# Patient Record
Sex: Male | Born: 1935 | Race: White | Hispanic: No | Marital: Married | Smoking: Current every day smoker
Health system: Southern US, Community
[De-identification: ages and names within clinical notes are randomized; demographics above are authoritative.]

## PROBLEM LIST (undated history)

## (undated) DIAGNOSIS — I639 Cerebral infarction, unspecified: Secondary | ICD-10-CM

## (undated) DIAGNOSIS — R0602 Shortness of breath: Secondary | ICD-10-CM

## (undated) DIAGNOSIS — J189 Pneumonia, unspecified organism: Secondary | ICD-10-CM

## (undated) DIAGNOSIS — R51 Headache: Secondary | ICD-10-CM

## (undated) DIAGNOSIS — I10 Essential (primary) hypertension: Secondary | ICD-10-CM

## (undated) DIAGNOSIS — I219 Acute myocardial infarction, unspecified: Secondary | ICD-10-CM

## (undated) DIAGNOSIS — G709 Myoneural disorder, unspecified: Secondary | ICD-10-CM

## (undated) DIAGNOSIS — S32000A Wedge compression fracture of unspecified lumbar vertebra, initial encounter for closed fracture: Secondary | ICD-10-CM

## (undated) DIAGNOSIS — I2510 Atherosclerotic heart disease of native coronary artery without angina pectoris: Secondary | ICD-10-CM

## (undated) DIAGNOSIS — F32A Depression, unspecified: Secondary | ICD-10-CM

## (undated) DIAGNOSIS — I739 Peripheral vascular disease, unspecified: Secondary | ICD-10-CM

## (undated) DIAGNOSIS — K219 Gastro-esophageal reflux disease without esophagitis: Secondary | ICD-10-CM

## (undated) DIAGNOSIS — G20A1 Parkinson's disease without dyskinesia, without mention of fluctuations: Secondary | ICD-10-CM

## (undated) DIAGNOSIS — F329 Major depressive disorder, single episode, unspecified: Secondary | ICD-10-CM

## (undated) DIAGNOSIS — G20 Parkinson's disease: Secondary | ICD-10-CM

## (undated) HISTORY — PX: BYPASS GRAFT: SHX909

## (undated) HISTORY — DX: Parkinson's disease without dyskinesia, without mention of fluctuations: G20.A1

## (undated) HISTORY — DX: Atherosclerotic heart disease of native coronary artery without angina pectoris: I25.10

## (undated) HISTORY — PX: CAROTID ENDARTERECTOMY: SUR193

## (undated) HISTORY — DX: Peripheral vascular disease, unspecified: I73.9

## (undated) HISTORY — DX: Parkinson's disease: G20

## (undated) HISTORY — PX: CARPAL TUNNEL RELEASE: SHX101

## (undated) HISTORY — PX: EYE SURGERY: SHX253

## (undated) HISTORY — PX: CARDIAC CATHETERIZATION: SHX172

---

## 2004-03-03 ENCOUNTER — Other Ambulatory Visit: Payer: Self-pay

## 2005-07-24 ENCOUNTER — Ambulatory Visit: Payer: Self-pay | Admitting: Gastroenterology

## 2005-12-05 ENCOUNTER — Ambulatory Visit: Payer: Self-pay | Admitting: Neurology

## 2005-12-12 ENCOUNTER — Encounter: Payer: Self-pay | Admitting: Neurology

## 2005-12-20 ENCOUNTER — Encounter: Payer: Self-pay | Admitting: Neurology

## 2006-01-17 ENCOUNTER — Encounter: Payer: Self-pay | Admitting: Neurology

## 2007-05-22 ENCOUNTER — Inpatient Hospital Stay: Payer: Self-pay | Admitting: Internal Medicine

## 2007-05-22 ENCOUNTER — Other Ambulatory Visit: Payer: Self-pay

## 2007-06-04 ENCOUNTER — Ambulatory Visit: Payer: Self-pay | Admitting: Vascular Surgery

## 2007-06-17 ENCOUNTER — Inpatient Hospital Stay: Payer: Self-pay | Admitting: Vascular Surgery

## 2007-06-20 ENCOUNTER — Other Ambulatory Visit: Payer: Self-pay

## 2007-06-21 ENCOUNTER — Inpatient Hospital Stay: Payer: Self-pay | Admitting: *Deleted

## 2009-02-22 ENCOUNTER — Ambulatory Visit: Payer: Self-pay | Admitting: Unknown Physician Specialty

## 2009-05-03 ENCOUNTER — Ambulatory Visit: Payer: Self-pay | Admitting: Family Medicine

## 2010-02-15 ENCOUNTER — Ambulatory Visit: Payer: Self-pay | Admitting: Ophthalmology

## 2010-02-28 ENCOUNTER — Ambulatory Visit: Payer: Self-pay | Admitting: Ophthalmology

## 2010-10-02 ENCOUNTER — Emergency Department: Payer: Self-pay | Admitting: Emergency Medicine

## 2010-10-10 ENCOUNTER — Ambulatory Visit: Payer: Self-pay | Admitting: Ophthalmology

## 2010-10-17 ENCOUNTER — Ambulatory Visit: Payer: Self-pay | Admitting: Ophthalmology

## 2010-11-16 ENCOUNTER — Ambulatory Visit: Payer: Self-pay | Admitting: Family Medicine

## 2011-05-22 ENCOUNTER — Emergency Department: Payer: Self-pay | Admitting: Emergency Medicine

## 2011-06-06 ENCOUNTER — Ambulatory Visit: Payer: Self-pay | Admitting: Family Medicine

## 2011-06-11 ENCOUNTER — Ambulatory Visit: Payer: Self-pay | Admitting: Family Medicine

## 2011-09-08 ENCOUNTER — Inpatient Hospital Stay: Payer: Self-pay | Admitting: Internal Medicine

## 2011-10-22 ENCOUNTER — Other Ambulatory Visit: Payer: Self-pay | Admitting: Neurology

## 2011-10-22 DIAGNOSIS — W19XXXA Unspecified fall, initial encounter: Secondary | ICD-10-CM

## 2011-10-22 DIAGNOSIS — G20 Parkinson's disease: Secondary | ICD-10-CM

## 2011-10-22 DIAGNOSIS — R269 Unspecified abnormalities of gait and mobility: Secondary | ICD-10-CM

## 2011-10-29 ENCOUNTER — Ambulatory Visit
Admission: RE | Admit: 2011-10-29 | Discharge: 2011-10-29 | Disposition: A | Discharge status: Home / Self Care | Payer: PRIVATE HEALTH INSURANCE | Source: Ambulatory Visit | Attending: Neurology | Admitting: Neurology

## 2011-10-29 DIAGNOSIS — R269 Unspecified abnormalities of gait and mobility: Secondary | ICD-10-CM

## 2011-10-29 DIAGNOSIS — W19XXXA Unspecified fall, initial encounter: Secondary | ICD-10-CM

## 2011-10-29 DIAGNOSIS — G20 Parkinson's disease: Secondary | ICD-10-CM

## 2012-02-06 ENCOUNTER — Encounter (HOSPITAL_COMMUNITY): Payer: Self-pay | Admitting: Pharmacy Technician

## 2012-02-08 ENCOUNTER — Other Ambulatory Visit (HOSPITAL_COMMUNITY): Payer: Self-pay | Admitting: *Deleted

## 2012-02-11 ENCOUNTER — Ambulatory Visit (HOSPITAL_COMMUNITY)
Admission: RE | Admit: 2012-02-11 | Discharge: 2012-02-11 | Disposition: A | Discharge status: Home / Self Care | Payer: Medicare Other | Source: Ambulatory Visit | Attending: Anesthesiology | Admitting: Anesthesiology

## 2012-02-11 ENCOUNTER — Encounter (HOSPITAL_COMMUNITY): Payer: Self-pay

## 2012-02-11 ENCOUNTER — Other Ambulatory Visit: Payer: Self-pay

## 2012-02-11 ENCOUNTER — Encounter (HOSPITAL_COMMUNITY)
Admission: RE | Admit: 2012-02-11 | Discharge: 2012-02-11 | Disposition: A | Discharge status: Home / Self Care | Payer: Medicare Other | Source: Ambulatory Visit | Attending: Neurosurgery | Admitting: Neurosurgery

## 2012-02-11 DIAGNOSIS — Z01818 Encounter for other preprocedural examination: Secondary | ICD-10-CM | POA: Insufficient documentation

## 2012-02-11 DIAGNOSIS — I10 Essential (primary) hypertension: Secondary | ICD-10-CM | POA: Insufficient documentation

## 2012-02-11 DIAGNOSIS — J90 Pleural effusion, not elsewhere classified: Secondary | ICD-10-CM | POA: Insufficient documentation

## 2012-02-11 DIAGNOSIS — R911 Solitary pulmonary nodule: Secondary | ICD-10-CM | POA: Insufficient documentation

## 2012-02-11 HISTORY — DX: Wedge compression fracture of unspecified lumbar vertebra, initial encounter for closed fracture: S32.000A

## 2012-02-11 HISTORY — DX: Gastro-esophageal reflux disease without esophagitis: K21.9

## 2012-02-11 HISTORY — DX: Pneumonia, unspecified organism: J18.9

## 2012-02-11 HISTORY — DX: Essential (primary) hypertension: I10

## 2012-02-11 HISTORY — DX: Shortness of breath: R06.02

## 2012-02-11 HISTORY — DX: Headache: R51

## 2012-02-11 HISTORY — DX: Myoneural disorder, unspecified: G70.9

## 2012-02-11 HISTORY — DX: Acute myocardial infarction, unspecified: I21.9

## 2012-02-11 HISTORY — DX: Depression, unspecified: F32.A

## 2012-02-11 HISTORY — DX: Cerebral infarction, unspecified: I63.9

## 2012-02-11 HISTORY — DX: Major depressive disorder, single episode, unspecified: F32.9

## 2012-02-11 LAB — BASIC METABOLIC PANEL
CO2: 27 mEq/L (ref 19–32)
Calcium: 10.5 mg/dL (ref 8.4–10.5)
GFR calc Af Amer: 74 mL/min — ABNORMAL LOW (ref >90)
Sodium: 141 mEq/L (ref 135–145)

## 2012-02-11 LAB — SURGICAL PCR SCREEN
MRSA, PCR: NEGATIVE
Staphylococcus aureus: NEGATIVE

## 2012-02-11 LAB — CBC
MCH: 34.5 pg — ABNORMAL HIGH (ref 26.0–34.0)
Platelets: 185 10*3/uL (ref 150–400)
RBC: 4.9 MIL/uL (ref 4.22–5.81)
RDW: 13.7 % (ref 11.5–15.5)
WBC: 7.8 10*3/uL (ref 4.0–10.5)

## 2012-02-11 NOTE — Progress Notes (Signed)
Requested records fr. Gavin Potters, re: cardiac history

## 2012-02-11 NOTE — Pre-Procedure Instructions (Signed)
20 Stephen Rollins  02/11/2012   Your procedure is scheduled on:  02/14/2012  Report to [name] at 5:30 AM.  Call this number if you have problems the morning of surgery: [phone]   Remember:   Do not eat food:After Midnight.  May have clear liquids: up to 4 Hours before arrival.  Clear liquids include soda, tea, black coffee, apple or grape juice, broth.  Take these medicines the morning of surgery with A SIP OF WATER:  SINEMET   Do not wear jewelry, make-up or nail polish.  Do not wear lotions, powders, or perfumes. You may wear deodorant.  Do not shave 48 hours prior to surgery.  Do not bring valuables to the hospital.  Contacts, dentures or bridgework may not be worn into surgery.  Leave suitcase in the car. After surgery it may be brought to your room.  For patients admitted to the hospital, checkout time is 11:00 AM the day of discharge.   Patients discharged the day of surgery will not be allowed to drive home.  Name and phone number of your driver:   With WIFE  Special Instructions: CHG Shower Use Special Wash: 1/2 bottle night before surgery and 1/2 bottle morning of surgery.   Please read over the following fact sheets that you were given: Pain Booklet, Coughing and Deep Breathing, MRSA Information and Surgical Site Infection Prevention

## 2012-02-11 NOTE — Progress Notes (Signed)
Will leave chart for review /w A. Zelenak, PAC, due to CXR report

## 2012-02-12 ENCOUNTER — Encounter (HOSPITAL_COMMUNITY): Payer: Self-pay | Admitting: Vascular Surgery

## 2012-02-12 NOTE — Consult Note (Addendum)
Anesthesia:  Patient is a 77 year old male scheduled for a L2 vertebroplasty on 02/14/12.  History includes smoking, HTN, CVA, CAD/ inferior MI s/p PCI 06/2001, GERD, depression, PNA, headaches, PVD s/p AFBG, Parkinson's disease, dementia.  By notes, he has known carotid disease that is followed by Dr. Earnestine Rollins.  His Cardiologist is Dr. Gwen Rollins.  He was last seen by his NP Stephen Rollins on 11/21/10 for dizziness and "presyncope".  A Holter test was ordered and consideration of a stress test if he was still "having spells that cannot be explained."  I called and spoke with Stephen Rollins.  He denies ever feeling like he was going to pass out, but continues to feel "wobbly" particularly when getting up from bed or his recliner in the morning and evening.  He feels these symptoms have "leveled off" and admits to feeling generally well.  He denies CP, SOB, LE edema.  He is not particularly active, but can do his day to day activities, shopping, etc., as long as he takes his time.  He reports that the Holter results were normal, but actual records are still pending.  His last stress test was in 2005.    His last echo on 11/25/06 showed normal LV systolic fucntion with EF 50%, trivial to mild MR/TR.  His EKG from 02/11/12 shows NSR, non-specific (flat) T wave abnormality in anterolateral leads.    CXR report from 02/11/12 showed: "No pulmonary edema, pneumonia, pleural effusion is evident. A small nodular density projects over the inferior aspect of the cardiac silhouette on lateral image. I cannot definitely identify it on the PA image. It measures 9 x 5 mm in diameter. It is not definitely calcified. Does the patient have any prior radiographic examinations elsewhere which could be obtained for comparison? If no prior study is available for comparison CT of the chest would be suggested to determine whether pulmonary nodule is present and to  further characterize it." I've left a message and with Stephen Rollins at [facility]  re: CXR results and have asked her to confirm with me once she receives the message.  Labs acceptable.  I reviewed his Cardiac history and symptomology with Anesthesiologist Dr. Katrinka Blazing.  If his Holter results did not reveal any significant/sustained abnormality and if he does not exhibit any new or progressive symptoms, then anticipate he can proceed.  I will follow-up with Stephen Rollins once I review his remaining Cardiology records.  Addendum: 02/13/12 1715  Earlier today I reviewed his 48 Holter monitor results from 11/28/10.  It showed baseline NSR with max rate of 89 and minimum of 53 bpm.  There were occasional PACs and PVCs, but no evidence of significant dysrhythmia or heart block.  I also called Stephen Rollins office multiple times today to confirm that he was aware of patient's CXR results.  I was continually placed into voicemail, so I again left a message for Stephen Rollins and also one with Stephen Rollins.  A copy of the report was also faxed to his office with confirmation.  Stephen Rollins's surgery is scheduled for 0730 tomorrow, so his assigned Anesthesiologist will have to confirm Stephen Rollins knowledge of his preoperative CXR results at that time.

## 2012-02-13 MED ORDER — CEFAZOLIN SODIUM 1-5 GM-% IV SOLN
1.0000 g | Freq: Once | INTRAVENOUS | Status: AC
  Administered 2012-02-14: 1 g via INTRAVENOUS
  Filled 2012-02-13: qty 50

## 2012-02-14 ENCOUNTER — Encounter (HOSPITAL_COMMUNITY): Payer: Self-pay | Admitting: Vascular Surgery

## 2012-02-14 ENCOUNTER — Encounter (HOSPITAL_COMMUNITY)
Admission: RE | Disposition: A | Discharge status: Home / Self Care | Payer: Self-pay | Source: Ambulatory Visit | Attending: Neurosurgery

## 2012-02-14 ENCOUNTER — Ambulatory Visit (HOSPITAL_COMMUNITY)
Admission: RE | Admit: 2012-02-14 | Discharge: 2012-02-14 | Disposition: A | Discharge status: Home / Self Care | Payer: PRIVATE HEALTH INSURANCE | Source: Ambulatory Visit | Attending: Neurosurgery | Admitting: Neurosurgery

## 2012-02-14 ENCOUNTER — Ambulatory Visit (HOSPITAL_COMMUNITY): Payer: PRIVATE HEALTH INSURANCE

## 2012-02-14 ENCOUNTER — Ambulatory Visit (HOSPITAL_COMMUNITY): Payer: PRIVATE HEALTH INSURANCE | Admitting: Vascular Surgery

## 2012-02-14 DIAGNOSIS — I10 Essential (primary) hypertension: Secondary | ICD-10-CM | POA: Insufficient documentation

## 2012-02-14 DIAGNOSIS — F329 Major depressive disorder, single episode, unspecified: Secondary | ICD-10-CM | POA: Insufficient documentation

## 2012-02-14 DIAGNOSIS — S32020A Wedge compression fracture of second lumbar vertebra, initial encounter for closed fracture: Secondary | ICD-10-CM

## 2012-02-14 DIAGNOSIS — G20A1 Parkinson's disease without dyskinesia, without mention of fluctuations: Secondary | ICD-10-CM | POA: Insufficient documentation

## 2012-02-14 DIAGNOSIS — F0390 Unspecified dementia without behavioral disturbance: Secondary | ICD-10-CM | POA: Insufficient documentation

## 2012-02-14 DIAGNOSIS — F3289 Other specified depressive episodes: Secondary | ICD-10-CM | POA: Insufficient documentation

## 2012-02-14 DIAGNOSIS — I252 Old myocardial infarction: Secondary | ICD-10-CM | POA: Insufficient documentation

## 2012-02-14 DIAGNOSIS — K219 Gastro-esophageal reflux disease without esophagitis: Secondary | ICD-10-CM | POA: Insufficient documentation

## 2012-02-14 DIAGNOSIS — M8448XA Pathological fracture, other site, initial encounter for fracture: Secondary | ICD-10-CM | POA: Insufficient documentation

## 2012-02-14 DIAGNOSIS — G20 Parkinson's disease: Secondary | ICD-10-CM | POA: Insufficient documentation

## 2012-02-14 DIAGNOSIS — I2510 Atherosclerotic heart disease of native coronary artery without angina pectoris: Secondary | ICD-10-CM | POA: Insufficient documentation

## 2012-02-14 DIAGNOSIS — Z8673 Personal history of transient ischemic attack (TIA), and cerebral infarction without residual deficits: Secondary | ICD-10-CM | POA: Insufficient documentation

## 2012-02-14 HISTORY — PX: VERTEBROPLASTY: SHX113

## 2012-02-14 SURGERY — VERTEBROPLASTY
Anesthesia: General | Site: Back | Wound class: Clean

## 2012-02-14 MED ORDER — IOHEXOL 300 MG/ML  SOLN
INTRAMUSCULAR | Status: DC | PRN
  Administered 2012-02-14: 50 mL via INTRAVENOUS

## 2012-02-14 MED ORDER — BUPIVACAINE HCL (PF) 0.25 % IJ SOLN
INTRAMUSCULAR | Status: DC | PRN
  Administered 2012-02-14: 10 mL

## 2012-02-14 MED ORDER — LACTATED RINGERS IV SOLN
INTRAVENOUS | Status: DC | PRN
  Administered 2012-02-14: 07:00:00 via INTRAVENOUS

## 2012-02-14 MED ORDER — ONDANSETRON HCL 4 MG/2ML IJ SOLN: 4.0000 mg | Freq: Once | INTRAMUSCULAR | Status: DC | PRN

## 2012-02-14 MED ORDER — PROPOFOL 10 MG/ML IV BOLUS
INTRAVENOUS | Status: DC | PRN
  Administered 2012-02-14: 150 mg via INTRAVENOUS

## 2012-02-14 MED ORDER — EPHEDRINE SULFATE 50 MG/ML IJ SOLN
INTRAMUSCULAR | Status: DC | PRN
  Administered 2012-02-14: 15 mg via INTRAVENOUS
  Administered 2012-02-14: 5 mg via INTRAVENOUS

## 2012-02-14 MED ORDER — LIDOCAINE HCL (CARDIAC) 20 MG/ML IV SOLN
INTRAVENOUS | Status: DC | PRN
  Administered 2012-02-14: 50 mg via INTRAVENOUS

## 2012-02-14 MED ORDER — HYDROMORPHONE HCL PF 1 MG/ML IJ SOLN: 0.2500 mg | INTRAMUSCULAR | Status: DC | PRN

## 2012-02-14 MED ORDER — HYDROCODONE-ACETAMINOPHEN 5-500 MG PO TABS: 1.0000 | ORAL_TABLET | Freq: Four times a day (QID) | ORAL | Status: AC | PRN

## 2012-02-14 MED ORDER — NEOSTIGMINE METHYLSULFATE 1 MG/ML IJ SOLN
INTRAMUSCULAR | Status: DC | PRN
  Administered 2012-02-14: 4 mg via INTRAVENOUS

## 2012-02-14 MED ORDER — GLYCOPYRROLATE 0.2 MG/ML IJ SOLN
INTRAMUSCULAR | Status: DC | PRN
  Administered 2012-02-14: 0.6 mg via INTRAVENOUS

## 2012-02-14 MED ORDER — FENTANYL CITRATE 0.05 MG/ML IJ SOLN
INTRAMUSCULAR | Status: DC | PRN
  Administered 2012-02-14: 100 ug via INTRAVENOUS

## 2012-02-14 MED ORDER — VECURONIUM BROMIDE 10 MG IV SOLR
INTRAVENOUS | Status: DC | PRN
  Administered 2012-02-14: 8 mg via INTRAVENOUS

## 2012-02-14 SURGICAL SUPPLY — 37 items
BANDAGE ADHESIVE 1X3 (GAUZE/BANDAGES/DRESSINGS) ×2 IMPLANT
BLADE SURG 15 STRL LF DISP TIS (BLADE) ×1 IMPLANT
BLADE SURG 15 STRL SS (BLADE) ×1
BLADE SURG ROTATE 9660 (MISCELLANEOUS) IMPLANT
CEMENT BONE KYPHX HV R (Orthopedic Implant) ×2 IMPLANT
CEMENT KYPHON C01A KIT/MIXER (Cement) ×2 IMPLANT
CLOTH BEACON ORANGE TIMEOUT ST (SAFETY) ×2 IMPLANT
CONT SPEC 4OZ CLIKSEAL STRL BL (MISCELLANEOUS) ×2 IMPLANT
DRAPE C-ARM 42X72 X-RAY (DRAPES) ×2 IMPLANT
DRAPE INCISE IOBAN 66X45 STRL (DRAPES) ×2 IMPLANT
DRAPE LAPAROTOMY 100X72X124 (DRAPES) ×2 IMPLANT
DURAPREP 26ML APPLICATOR (WOUND CARE) ×2 IMPLANT
GAUZE SPONGE 4X4 16PLY XRAY LF (GAUZE/BANDAGES/DRESSINGS) ×2 IMPLANT
GLOVE BIOGEL PI IND STRL 7.0 (GLOVE) ×2 IMPLANT
GLOVE BIOGEL PI INDICATOR 7.0 (GLOVE) ×2
GLOVE ECLIPSE 7.5 STRL STRAW (GLOVE) ×2 IMPLANT
GLOVE EXAM NITRILE LRG STRL (GLOVE) IMPLANT
GLOVE EXAM NITRILE XL STR (GLOVE) IMPLANT
GLOVE EXAM NITRILE XS STR PU (GLOVE) IMPLANT
GLOVE SS BIOGEL STRL SZ 6.5 (GLOVE) ×1 IMPLANT
GLOVE SUPERSENSE BIOGEL SZ 6.5 (GLOVE) ×1
GLOVE SURG SS PI 6.5 STRL IVOR (GLOVE) ×2 IMPLANT
GLOVE SURG SS PI 7.0 STRL IVOR (GLOVE) ×2 IMPLANT
GOWN BRE IMP SLV AUR LG STRL (GOWN DISPOSABLE) ×6 IMPLANT
GOWN BRE IMP SLV AUR XL STRL (GOWN DISPOSABLE) IMPLANT
GOWN STRL REIN 2XL LVL4 (GOWN DISPOSABLE) IMPLANT
KIT BASIN OR (CUSTOM PROCEDURE TRAY) ×2 IMPLANT
KIT ROOM TURNOVER OR (KITS) ×2 IMPLANT
KYPHON MIXER PACK ×2 IMPLANT
MARKER SKIN DUAL TIP RULER LAB (MISCELLANEOUS) ×2 IMPLANT
NEEDLE HYPO 22GX1.5 SAFETY (NEEDLE) ×2 IMPLANT
PACK EENT II TURBAN DRAPE (CUSTOM PROCEDURE TRAY) ×2 IMPLANT
PAD ARMBOARD 7.5X6 YLW CONV (MISCELLANEOUS) ×4 IMPLANT
STAPLER SKIN PROX WIDE 3.9 (STAPLE) ×2 IMPLANT
TOWEL OR 17X24 6PK STRL BLUE (TOWEL DISPOSABLE) ×2 IMPLANT
TOWEL OR 17X26 10 PK STRL BLUE (TOWEL DISPOSABLE) ×2 IMPLANT
TRAY KYPHOPAK 20/3 ONESTEP 1ST (MISCELLANEOUS) ×2 IMPLANT

## 2012-02-14 NOTE — Discharge Summary (Signed)
  L2 compression fracture. L2 vertebroplasty without issues. Patient discharged after procedure with no obvious complications. Condition improved vs pre op.

## 2012-02-14 NOTE — Transfer of Care (Signed)
Immediate Anesthesia Transfer of Care Note  Patient: Stephen Rollins  Procedure(s) Performed: Procedure(s) (LRB): VERTEBROPLASTY (N/A)  Patient Location: PACU  Anesthesia Type: General  Level of Consciousness: awake, alert  and oriented  Airway & Oxygen Therapy: Patient Spontanous Breathing and Patient connected to nasal cannula oxygen  Post-op Assessment: Report given to PACU RN, Post -op Vital signs reviewed and stable and Patient moving all extremities X 4  Post vital signs: Reviewed and stable  Complications: No apparent anesthesia complications

## 2012-02-14 NOTE — Op Note (Signed)
Preop diagnosis: Compression fracture L2 Postop diagnosis: Same Procedure: L2 vertebroplasty Surgeon: Noris Kulinski  After and placed in the prone position the patient's back was prepped and draped in the usual sterile fashion. AP and lateral fluoroscopy were brought into the field and wanted the appropriate fashion. Small stab wound incision was made in line with the lateral edge of the pedicle of L2 on the left and the Jamshidi needle was passed from a lateral to medial and superior to inferior direction to the pedicle without difficulty. Once it was in good position remove the inner cannula and used a drill to make a cavity. We then gently injected the cement and watched it fill the vertebral body nicely. We had adequate fill we slowly withdrew the Jamshidi needle and compressed the cement into the vertebral body. We then removed the Jamshidi needle completely I did final fluoroscopy which looked excellent. It was then washed and closed with 2 staples. The patient was extubated and taken to recovery room in stable condition.

## 2012-02-14 NOTE — Anesthesia Preprocedure Evaluation (Addendum)
Anesthesia Evaluation  Patient identified by MRN, date of birth, ID band Patient awake    Reviewed: Allergy & Precautions, H&P , NPO status , Patient's Chart, lab work & pertinent test results  Airway Mallampati: II      Dental  (+) Edentulous Upper and Edentulous Lower   Pulmonary          Cardiovascular hypertension, Pt. on medications + CAD and + Past MI     Neuro/Psych Depression  Neuromuscular disease CVA, No Residual Symptoms    GI/Hepatic GERD-  Controlled and Medicated,  Endo/Other    Renal/GU      Musculoskeletal   Abdominal   Peds  Hematology   Anesthesia Other Findings   Reproductive/Obstetrics                          Anesthesia Physical Anesthesia Plan  ASA: III  Anesthesia Plan: General   Post-op Pain Management:    Induction: Intravenous  Airway Management Planned: Oral ETT  Additional Equipment:   Intra-op Plan:   Post-operative Plan: Extubation in OR  Informed Consent: I have reviewed the patients History and Physical, chart, labs and discussed the procedure including the risks, benefits and alternatives for the proposed anesthesia with the patient or authorized representative who has indicated his/her understanding and acceptance.     Plan Discussed with: CRNA and Surgeon  Anesthesia Plan Comments:         Anesthesia Quick Evaluation

## 2012-02-14 NOTE — H&P (Signed)
  Stephen Rollins is an 77 y.o. male.   Chief Complaint: Back pain HPI: The patient is a 77 year old gentleman with severe back pain. He was noted to have a compression fracture of L2 that did not resolve with bracing and medications. The options were discussed including vertebral plasty and he wanted to proceed with that procedure and now comes for L2 vertebroplasty.  Past Medical History  Diagnosis Date  . Hypertension     see Dr. Gwen Pounds- [facility]-. stress test las t 2006, cardiac  cath  . Myocardial infarction     2006  . Stroke     2008  . Shortness of breath   . Pneumonia     1999- hosp.  Marland Kitchen GERD (gastroesophageal reflux disease)   . Headache   . Neuromuscular disorder     Parkinson's disease, diagnosed 2008  . Depression   . Lumbar compression fracture     L2- post fall  . DEMENTIA     per wife  . Parkinson disease     dx 2008  . Coronary artery disease     Dr. Arnoldo Hooker, prior PCI, inferior MI 8/02  . PAD (peripheral artery disease)     s/p AFBG    Past Surgical History  Procedure Date  . Cardiac catheterization     stent- x1 (2006)  . Bypass graft     1998- ?iliac- femoral- aortic    . Carotid endarterectomy     L side- 2009  . Carpal tunnel release     R hand  . Eye surgery     cataracts removed- both eyes & IOL    Family History  Problem Relation Age of Onset  . Anesthesia problems Neg Hx    Social History:  reports that he has been smoking.  He does not have any smokeless tobacco history on file. He reports that he drinks about .6 ounces of alcohol per week. He reports that he does not use illicit drugs.  Allergies: No Known Allergies  Medications Prior to Admission  Medication Dose Route Frequency Provider Last Rate Last Dose  . ceFAZolin (ANCEF) IVPB 1 g/50 mL premix  1 g Intravenous Once Reinaldo Meeker, MD       No current outpatient prescriptions on file as of 02/14/2012.    No results found for this or any previous visit (from the  past 48 hour(s)). No results found.  Review of systems not obtained due to patient factors.  Blood pressure 110/67, pulse 65, temperature 97 F (36.1 C), temperature source Oral, resp. rate 18, SpO2 96.00%.  The patient is awake alert and oriented with no facial asymmetry. His gait is very slow secondary to pain. Strength is 5 over 5 as is sensation. Assessment/Plan Impression is that of a compression fracture and the plan is for a vertebral plasty.  Reinaldo Meeker, MD 02/14/2012, 7:35 AM

## 2012-02-14 NOTE — Anesthesia Postprocedure Evaluation (Signed)
  Anesthesia Post-op Note  Patient: Stephen Rollins  Procedure(s) Performed: Procedure(s) (LRB): VERTEBROPLASTY (N/A)  Patient Location: PACU  Anesthesia Type: General  Level of Consciousness: awake, alert  and oriented  Airway and Oxygen Therapy: Patient Spontanous Breathing  Post-op Pain: none  Post-op Assessment: Post-op Vital signs reviewed and Patient's Cardiovascular Status Stable  Post-op Vital Signs: stable  Complications: No apparent anesthesia complications

## 2012-02-14 NOTE — Preoperative (Signed)
Beta Blockers   Reason not to administer Beta Blockers:Not Applicable 

## 2012-02-14 NOTE — Anesthesia Procedure Notes (Signed)
Procedure Name: Intubation Date/Time: 02/14/2012 7:45 AM Performed by: Marena Chancy Pre-anesthesia Checklist: Patient identified, Timeout performed, Emergency Drugs available, Suction available and Patient being monitored Patient Re-evaluated:Patient Re-evaluated prior to inductionOxygen Delivery Method: Circle system utilized Preoxygenation: Pre-oxygenation with 100% oxygen Intubation Type: IV induction Ventilation: Mask ventilation without difficulty and Oral airway inserted - appropriate to patient size Laryngoscope Size: Hyacinth Meeker and 2 Grade View: Grade I Tube type: Oral Tube size: 7.5 mm Number of attempts: 1 Secured at: 23 cm Tube secured with: Tape Dental Injury: Teeth and Oropharynx as per pre-operative assessment

## 2012-02-14 NOTE — Discharge Instructions (Signed)
Instructions Following General Anesthetic, Adult A nurse specialized in giving anesthesia (anesthetist) or a doctor specialized in giving anesthesia (anesthesiologist) gave you a medicine that made you sleep while a procedure was performed. For as long as 24 hours following this procedure, you may feel:  Dizzy.   Weak.   Drowsy.  AFTER THE PROCEDURE After surgery, you will be taken to the recovery area where a nurse will monitor your progress. You will be allowed to go home when you are awake, stable, taking fluids well, and without complications. For the first 24 hours following an anesthetic:  Have a responsible person with you.   Do not drive a car. If you are alone, do not take public transportation.   Do not drink alcohol.   Do not take medicine that has not been prescribed by your caregiver.   Do not sign important papers or make important decisions.   You may resume normal diet and activities as directed.   Change bandages (dressings) as directed.   Only take over-the-counter or prescription medicines for pain, discomfort, or fever as directed by your caregiver.  If you have questions or problems that seem related to the anesthetic, call the hospital and ask for the anesthetist or anesthesiologist on call. SEEK IMMEDIATE MEDICAL CARE IF:   You develop a rash.   You have difficulty breathing.   You have chest pain.   You develop any allergic problems.  Document Released: 02/11/2001 Document Revised: 10/25/2011 Document Reviewed: 09/22/2007 [employer] Patient Information 2012 [employer]. 

## 2012-02-14 NOTE — Brief Op Note (Signed)
02/14/2012  8:33 AM  PATIENT:  Guadlupe Spanish  77 y.o. male  PRE-OPERATIVE DIAGNOSIS:  Lumbar two fracture  POST-OPERATIVE DIAGNOSIS:  Lumbar two fracture  PROCEDURE:  Procedure(s) (LRB): VERTEBROPLASTY (N/A)  SURGEON:  Surgeon(s) and Role:    * Reinaldo Meeker, MD - Primary  PHYSICIAN ASSISTANT:   ASSISTANTS: none   ANESTHESIA:   general  EBL:     BLOOD ADMINISTERED:none  DRAINS: none   LOCAL MEDICATIONS USED:  MARCAINE     SPECIMEN:  No Specimen  DISPOSITION OF SPECIMEN:  N/A  COUNTS:  YES  TOURNIQUET:  * No tourniquets in log *  DICTATION: .Dragon Dictation  PLAN OF CARE: Discharge to home after PACU  PATIENT DISPOSITION:  PACU - hemodynamically stable.   Delay start of Pharmacological VTE agent (>24hrs) due to surgical blood loss or risk of bleeding: yes

## 2012-02-19 ENCOUNTER — Encounter (HOSPITAL_COMMUNITY): Payer: Self-pay | Admitting: Neurosurgery

## 2012-03-17 ENCOUNTER — Ambulatory Visit: Payer: Self-pay | Admitting: Family Medicine

## 2012-03-31 ENCOUNTER — Ambulatory Visit: Payer: Self-pay | Admitting: Family Medicine

## 2012-04-06 LAB — COMPREHENSIVE METABOLIC PANEL
Bilirubin,Total: 0.3 mg/dL (ref 0.2–1.0)
Calcium, Total: 9.4 mg/dL (ref 8.5–10.1)
Chloride: 109 mmol/L — ABNORMAL HIGH (ref 98–107)
EGFR (African American): >60
EGFR (Non-African Amer.): >60
Glucose: 105 mg/dL — ABNORMAL HIGH (ref 65–99)
Osmolality: 288 (ref 275–301)
Potassium: 4.1 mmol/L (ref 3.5–5.1)
SGOT(AST): 19 U/L (ref 15–37)
Sodium: 142 mmol/L (ref 136–145)
Total Protein: 6 g/dL — ABNORMAL LOW (ref 6.4–8.2)

## 2012-04-06 LAB — CBC
HCT: 41.4 % (ref 40.0–52.0)
MCH: 34.1 pg — ABNORMAL HIGH (ref 26.0–34.0)
MCHC: 34.4 g/dL (ref 32.0–36.0)
RBC: 4.19 10*6/uL — ABNORMAL LOW (ref 4.40–5.90)
RDW: 13.9 % (ref 11.5–14.5)
WBC: 8.8 10*3/uL (ref 3.8–10.6)

## 2012-04-06 LAB — TROPONIN I: Troponin-I: <0.02 ng/mL

## 2012-04-06 LAB — PRO B NATRIURETIC PEPTIDE: B-Type Natriuretic Peptide: 287 pg/mL — ABNORMAL HIGH (ref 0–125)

## 2012-04-06 LAB — CK TOTAL AND CKMB (NOT AT ARMC): CK-MB: 0.7 ng/mL (ref 0.5–3.6)

## 2012-04-07 ENCOUNTER — Observation Stay: Payer: Self-pay | Admitting: Internal Medicine

## 2012-04-07 LAB — URINALYSIS, COMPLETE
Bilirubin,UR: NEGATIVE
Glucose,UR: NEGATIVE mg/dL (ref 0–75)
Nitrite: POSITIVE
Ph: 6 (ref 4.5–8.0)
Protein: NEGATIVE
RBC,UR: 52 /HPF (ref 0–5)
Squamous Epithelial: <1
WBC UR: 41 /HPF (ref 0–5)

## 2012-04-07 LAB — TROPONIN I
Troponin-I: <0.02 ng/mL
Troponin-I: <0.02 ng/mL

## 2012-04-08 LAB — BASIC METABOLIC PANEL
Calcium, Total: 8.7 mg/dL (ref 8.5–10.1)
Co2: 26 mmol/L (ref 21–32)
EGFR (African American): >60
Osmolality: 284 (ref 275–301)
Potassium: 3.7 mmol/L (ref 3.5–5.1)

## 2012-04-08 LAB — LIPID PANEL
Cholesterol: 112 mg/dL (ref 0–200)
Ldl Cholesterol, Calc: 60 mg/dL (ref 0–100)

## 2012-04-09 LAB — CBC WITH DIFFERENTIAL/PLATELET
Basophil %: 0.9 %
Eosinophil %: 2.8 %
HCT: 44.4 % (ref 40.0–52.0)
Lymphocyte #: 0.8 10*3/uL — ABNORMAL LOW (ref 1.0–3.6)
Lymphocyte %: 9.6 %
MCH: 33.2 pg (ref 26.0–34.0)
MCHC: 33.6 g/dL (ref 32.0–36.0)
MCV: 99 fL (ref 80–100)
Monocyte #: 0.8 x10 3/mm (ref 0.2–1.0)
Monocyte %: 9 %
Neutrophil #: 6.8 10*3/uL — ABNORMAL HIGH (ref 1.4–6.5)
Neutrophil %: 77.7 %
Platelet: 196 10*3/uL (ref 150–440)
RDW: 13.6 % (ref 11.5–14.5)
WBC: 8.7 10*3/uL (ref 3.8–10.6)

## 2012-06-19 LAB — COMPREHENSIVE METABOLIC PANEL
Alkaline Phosphatase: 87 U/L (ref 50–136)
Calcium, Total: 9.8 mg/dL (ref 8.5–10.1)
Co2: 25 mmol/L (ref 21–32)
EGFR (Non-African Amer.): 48 — ABNORMAL LOW
Glucose: 107 mg/dL — ABNORMAL HIGH (ref 65–99)
Osmolality: 287 (ref 275–301)
SGOT(AST): 17 U/L (ref 15–37)
SGPT (ALT): <6 U/L — ABNORMAL LOW

## 2012-06-19 LAB — CBC
HCT: 43.7 % (ref 40.0–52.0)
HGB: 15.1 g/dL (ref 13.0–18.0)
MCH: 33.5 pg (ref 26.0–34.0)
MCV: 97 fL (ref 80–100)
RBC: 4.51 10*6/uL (ref 4.40–5.90)

## 2012-06-20 ENCOUNTER — Inpatient Hospital Stay: Payer: Self-pay | Admitting: Internal Medicine

## 2012-06-20 LAB — BASIC METABOLIC PANEL
Anion Gap: 8 (ref 7–16)
Chloride: 109 mmol/L — ABNORMAL HIGH (ref 98–107)
Co2: 26 mmol/L (ref 21–32)
Creatinine: 1.22 mg/dL (ref 0.60–1.30)
Potassium: 3.7 mmol/L (ref 3.5–5.1)

## 2012-06-20 LAB — CBC WITH DIFFERENTIAL/PLATELET
Basophil #: 0.1 10*3/uL (ref 0.0–0.1)
Eosinophil #: 0 10*3/uL (ref 0.0–0.7)
Eosinophil %: 0.2 %
HCT: 42.5 % (ref 40.0–52.0)
HGB: 14.6 g/dL (ref 13.0–18.0)
Lymphocyte #: 0.7 10*3/uL — ABNORMAL LOW (ref 1.0–3.6)
MCV: 98 fL (ref 80–100)
Monocyte %: 9.5 %
Neutrophil #: 16.6 10*3/uL — ABNORMAL HIGH (ref 1.4–6.5)
RBC: 4.34 10*6/uL — ABNORMAL LOW (ref 4.40–5.90)
WBC: 19.2 10*3/uL — ABNORMAL HIGH (ref 3.8–10.6)

## 2012-06-20 LAB — URINALYSIS, COMPLETE
Hyaline Cast: 3
Nitrite: NEGATIVE
Ph: 5 (ref 4.5–8.0)
Protein: 30
Squamous Epithelial: <1
WBC UR: 41 /HPF (ref 0–5)

## 2012-06-21 LAB — BASIC METABOLIC PANEL
Anion Gap: 9 (ref 7–16)
BUN: 16 mg/dL (ref 7–18)
Co2: 23 mmol/L (ref 21–32)
Creatinine: 1.15 mg/dL (ref 0.60–1.30)
EGFR (African American): >60

## 2012-06-21 LAB — CBC WITH DIFFERENTIAL/PLATELET
Basophil %: 0.3 %
Eosinophil #: 0.1 10*3/uL (ref 0.0–0.7)
HGB: 14.1 g/dL (ref 13.0–18.0)
MCH: 33.4 pg (ref 26.0–34.0)
Monocyte #: 1.7 x10 3/mm — ABNORMAL HIGH (ref 0.2–1.0)
Neutrophil #: 11.4 10*3/uL — ABNORMAL HIGH (ref 1.4–6.5)
RBC: 4.23 10*6/uL — ABNORMAL LOW (ref 4.40–5.90)
WBC: 14.3 10*3/uL — ABNORMAL HIGH (ref 3.8–10.6)

## 2012-06-22 LAB — CBC WITH DIFFERENTIAL/PLATELET
Eosinophil #: 0.2 10*3/uL (ref 0.0–0.7)
Eosinophil %: 1.7 %
Lymphocyte %: 10.4 %
MCH: 33.2 pg (ref 26.0–34.0)
MCHC: 34.2 g/dL (ref 32.0–36.0)
Monocyte #: 1 x10 3/mm (ref 0.2–1.0)
Neutrophil %: 76.6 %
Platelet: 164 10*3/uL (ref 150–440)
RBC: 3.95 10*6/uL — ABNORMAL LOW (ref 4.40–5.90)
WBC: 9.2 10*3/uL (ref 3.8–10.6)

## 2012-06-23 LAB — URINE CULTURE

## 2012-06-24 LAB — CULTURE, BLOOD (SINGLE)

## 2012-07-20 ENCOUNTER — Emergency Department: Payer: Self-pay | Admitting: Emergency Medicine

## 2012-07-20 LAB — COMPREHENSIVE METABOLIC PANEL
Anion Gap: 6 — ABNORMAL LOW (ref 7–16)
Bilirubin,Total: 0.5 mg/dL (ref 0.2–1.0)
Calcium, Total: 10.5 mg/dL — ABNORMAL HIGH (ref 8.5–10.1)
Chloride: 105 mmol/L (ref 98–107)
Co2: 29 mmol/L (ref 21–32)
Creatinine: 1.27 mg/dL (ref 0.60–1.30)
Osmolality: 285 (ref 275–301)
Sodium: 140 mmol/L (ref 136–145)

## 2012-07-20 LAB — CBC
HCT: 45.1 % (ref 40.0–52.0)
MCH: 33.4 pg (ref 26.0–34.0)
MCHC: 34.3 g/dL (ref 32.0–36.0)
RDW: 14.8 % — ABNORMAL HIGH (ref 11.5–14.5)

## 2012-07-20 LAB — TROPONIN I: Troponin-I: <0.02 ng/mL

## 2012-07-20 LAB — CK TOTAL AND CKMB (NOT AT ARMC): CK, Total: 58 U/L (ref 35–232)

## 2013-02-14 ENCOUNTER — Telehealth: Payer: Self-pay | Admitting: *Deleted

## 2013-02-14 NOTE — Telephone Encounter (Signed)
Spoke to wife to cancel will call next week to reschedule.

## 2013-02-23 ENCOUNTER — Inpatient Hospital Stay: Payer: Self-pay | Admitting: Internal Medicine

## 2013-02-23 LAB — BASIC METABOLIC PANEL
Anion Gap: 3 — ABNORMAL LOW (ref 7–16)
BUN: 26 mg/dL — ABNORMAL HIGH (ref 7–18)
Calcium, Total: 10.3 mg/dL — ABNORMAL HIGH (ref 8.5–10.1)
Co2: 30 mmol/L (ref 21–32)
Creatinine: 1.5 mg/dL — ABNORMAL HIGH (ref 0.60–1.30)
EGFR (African American): 52 — ABNORMAL LOW
EGFR (Non-African Amer.): 45 — ABNORMAL LOW
Osmolality: 279 (ref 275–301)
Potassium: 4.2 mmol/L (ref 3.5–5.1)
Sodium: 137 mmol/L (ref 136–145)

## 2013-02-23 LAB — CBC
HCT: 48.8 % (ref 40.0–52.0)
HGB: 16.7 g/dL (ref 13.0–18.0)
MCHC: 34.2 g/dL (ref 32.0–36.0)
Platelet: 185 10*3/uL (ref 150–440)
RBC: 4.95 10*6/uL (ref 4.40–5.90)
WBC: 13 10*3/uL — ABNORMAL HIGH (ref 3.8–10.6)

## 2013-02-23 LAB — CK TOTAL AND CKMB (NOT AT ARMC)
CK, Total: 87 U/L (ref 35–232)
CK, Total: 1521 U/L — ABNORMAL HIGH (ref 35–232)
CK-MB: 4.2 ng/mL — ABNORMAL HIGH (ref 0.5–3.6)
CK-MB: 121.4 ng/mL — ABNORMAL HIGH (ref 0.5–3.6)

## 2013-02-23 LAB — PROTIME-INR
INR: 0.9
Prothrombin Time: 12.7 secs (ref 11.5–14.7)

## 2013-02-23 LAB — APTT: Activated PTT: 93.4 secs — ABNORMAL HIGH (ref 23.6–35.9)

## 2013-02-23 LAB — TROPONIN I: Troponin-I: 34.83 ng/mL — ABNORMAL HIGH

## 2013-02-24 LAB — BASIC METABOLIC PANEL
BUN: 25 mg/dL — ABNORMAL HIGH (ref 7–18)
Chloride: 108 mmol/L — ABNORMAL HIGH (ref 98–107)
EGFR (African American): >60
Glucose: 98 mg/dL (ref 65–99)
Osmolality: 284 (ref 275–301)

## 2013-02-24 LAB — CBC WITH DIFFERENTIAL/PLATELET
Basophil #: 0.1 10*3/uL (ref 0.0–0.1)
Eosinophil #: 0.1 10*3/uL (ref 0.0–0.7)
Eosinophil %: 1.1 %
HGB: 14.6 g/dL (ref 13.0–18.0)
Lymphocyte %: 9.5 %
MCH: 33.3 pg (ref 26.0–34.0)
MCV: 100 fL (ref 80–100)
Monocyte #: 1.4 x10 3/mm — ABNORMAL HIGH (ref 0.2–1.0)
Monocyte %: 11.1 %
Neutrophil #: 9.8 10*3/uL — ABNORMAL HIGH (ref 1.4–6.5)
Neutrophil %: 77.9 %
RBC: 4.4 10*6/uL (ref 4.40–5.90)

## 2013-02-27 ENCOUNTER — Ambulatory Visit: Payer: Self-pay | Admitting: Nurse Practitioner

## 2013-03-03 ENCOUNTER — Telehealth: Payer: Self-pay | Admitting: *Deleted

## 2013-03-03 NOTE — Telephone Encounter (Signed)
Steward Drone has returned my call and will not need to r/s the patient appointment. The patient has passed away this past 2023-04-26.

## 2013-03-03 NOTE — Telephone Encounter (Signed)
Called Steward Drone and left a message for her to call the office back to schedule the patient an appointment since his last one had to be canceled.

## 2013-03-19 DEATH — deceased

## 2013-05-08 IMAGING — CR DG CHEST 2V
2 series · 2 of 2 positions shown · non-contrast
Comparison: None.

CLINICAL DATA: Preoperative evaluation.  History of hypertension.

CHEST - 2 VIEW

[view not recorded (1 of 2)]
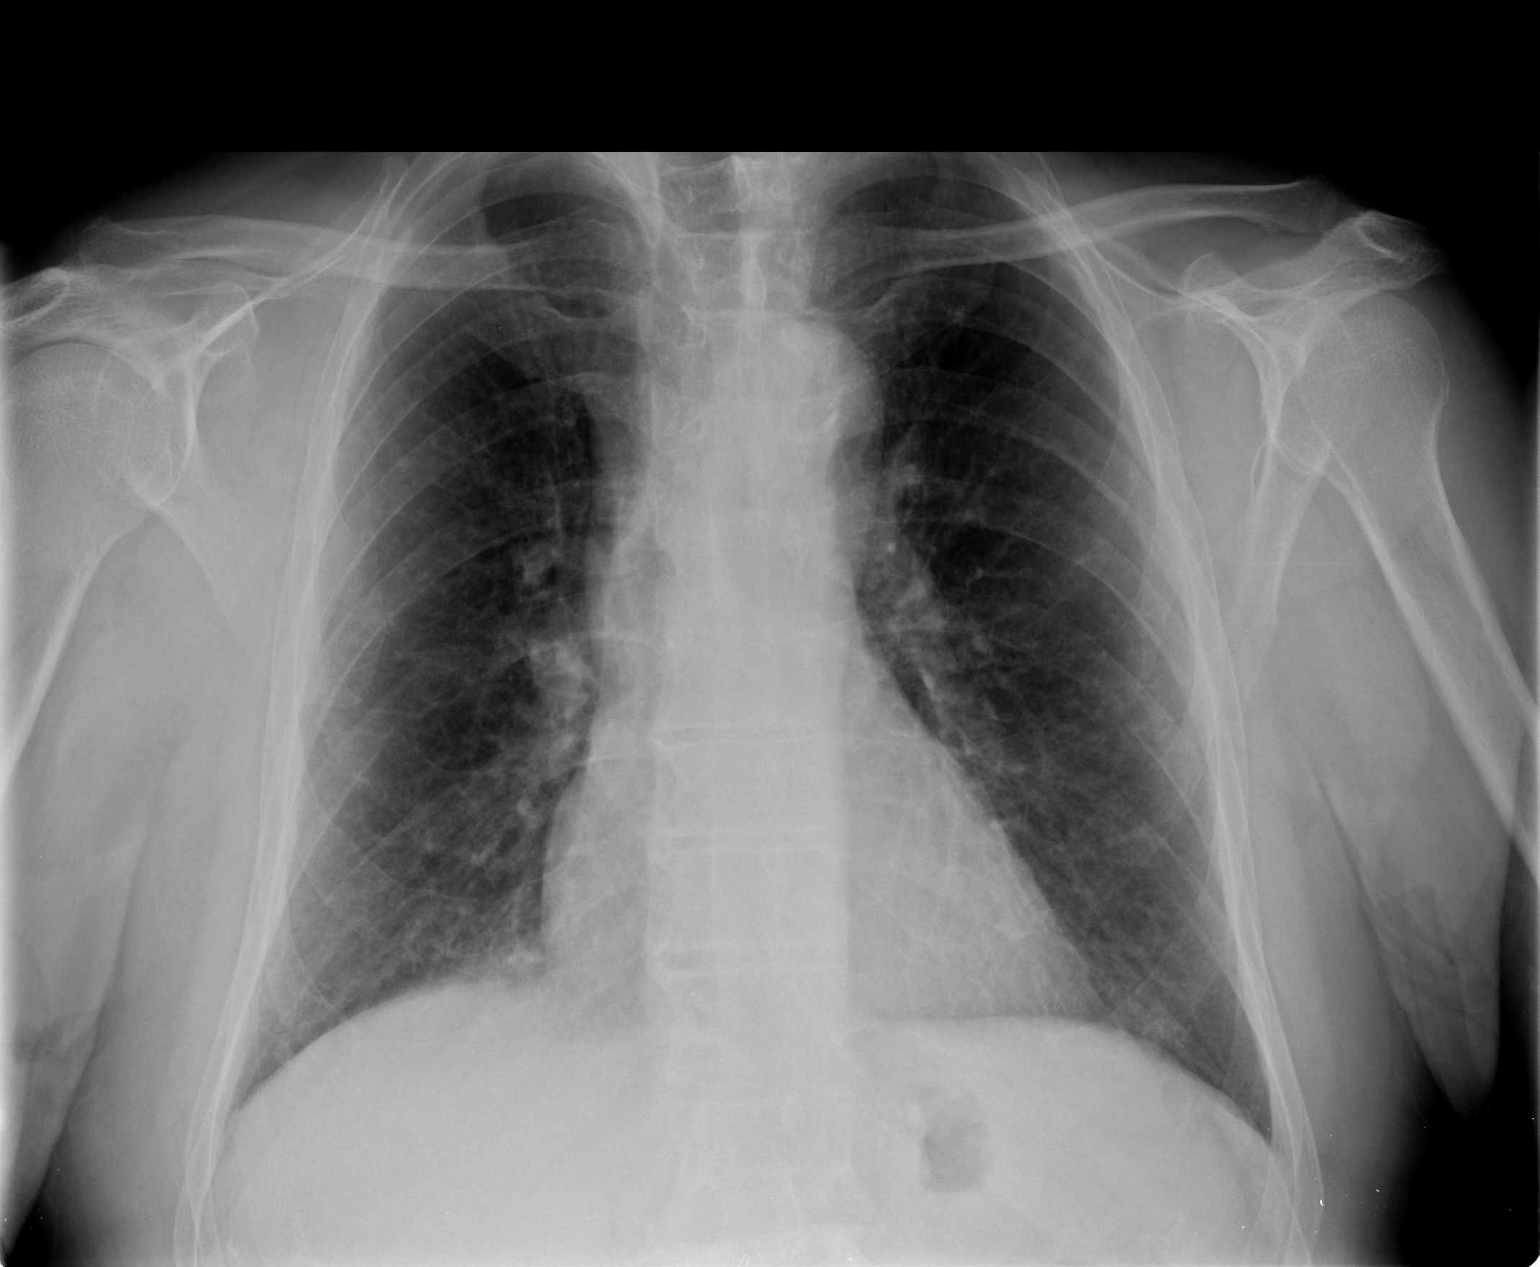

[view not recorded (2 of 2)]
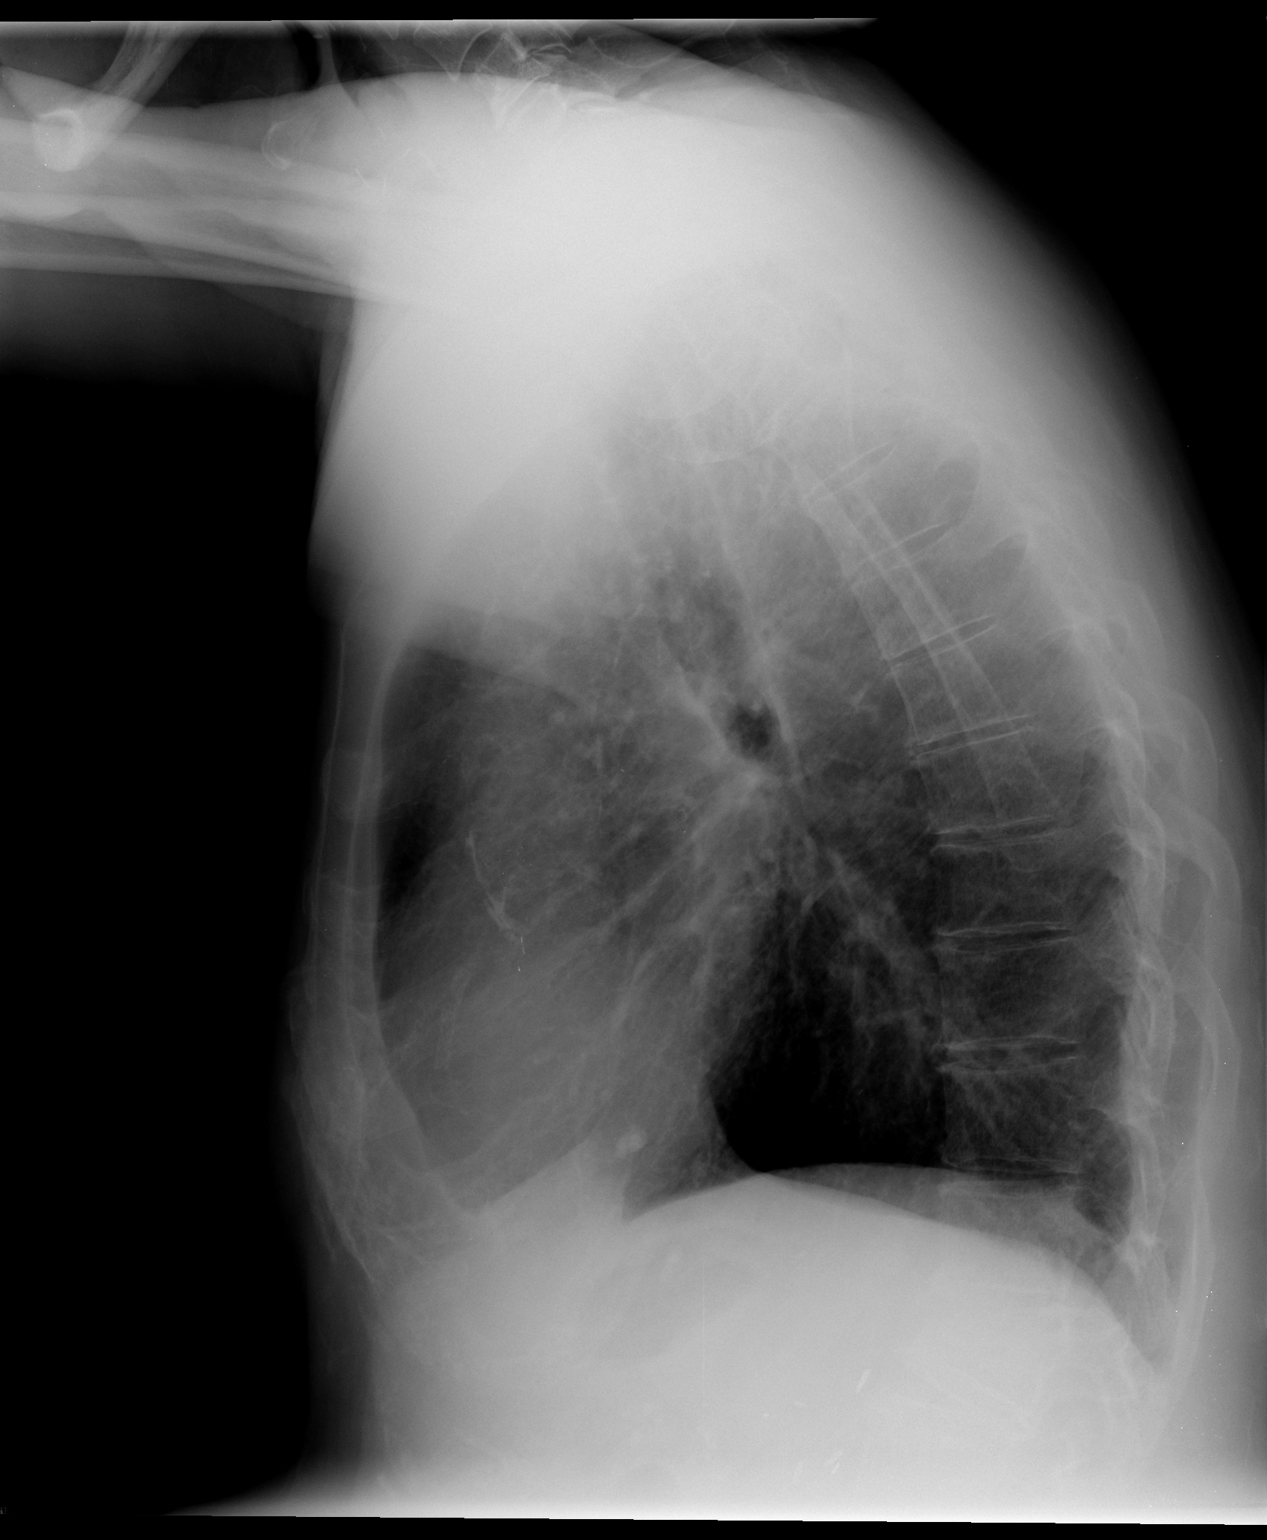

[2 of 2 positions shown; findings below may reference images not displayed]

FINDINGS: The cardiac silhouette is normal size and shape. Ectasia
and nonaneurysmal calcification of the thoracic aorta are seen.
There is slight nonspecific prominence of reticular interstitial
markings.  A small nodular density projects over the inferior
aspect of the cardiac silhouette on lateral image.  I cannot
definitely identify it on the PA image.  It measures 9 x 5 mm in
diameter.  It is not definitely calcified.  No pulmonary edema,
pneumonia, or pleural effusion is seen.  There is osteopenic
appearance of the bones.  Changes of degenerative spondylosis and
degenerative disc disease are seen.
IMPRESSION: No pulmonary edema, pneumonia, pleural effusion is evident.

 A small nodular density projects over the inferior aspect of the
cardiac silhouette on lateral image.  I cannot definitely identify
it on the PA image.  It measures 9 x 5 mm in diameter.  It is not
definitely calcified. Does the patient have any prior radiographic
examinations elsewhere which could be obtained for comparison?  If
no prior study is available for comparison CT of the chest would be
suggested to determine whether pulmonary nodule is present and to
further characterize it..

## 2013-07-02 IMAGING — CR DG CHEST 2V
1 series · 2 of 2 positions shown · non-contrast
Comparison: none

REASON FOR EXAM: dizziness
COMMENTS:   May transport without cardiac monitor

[Series 1: x chest ap · 0.14mm/px · 2 of 2 slices shown]
[im 1/2]
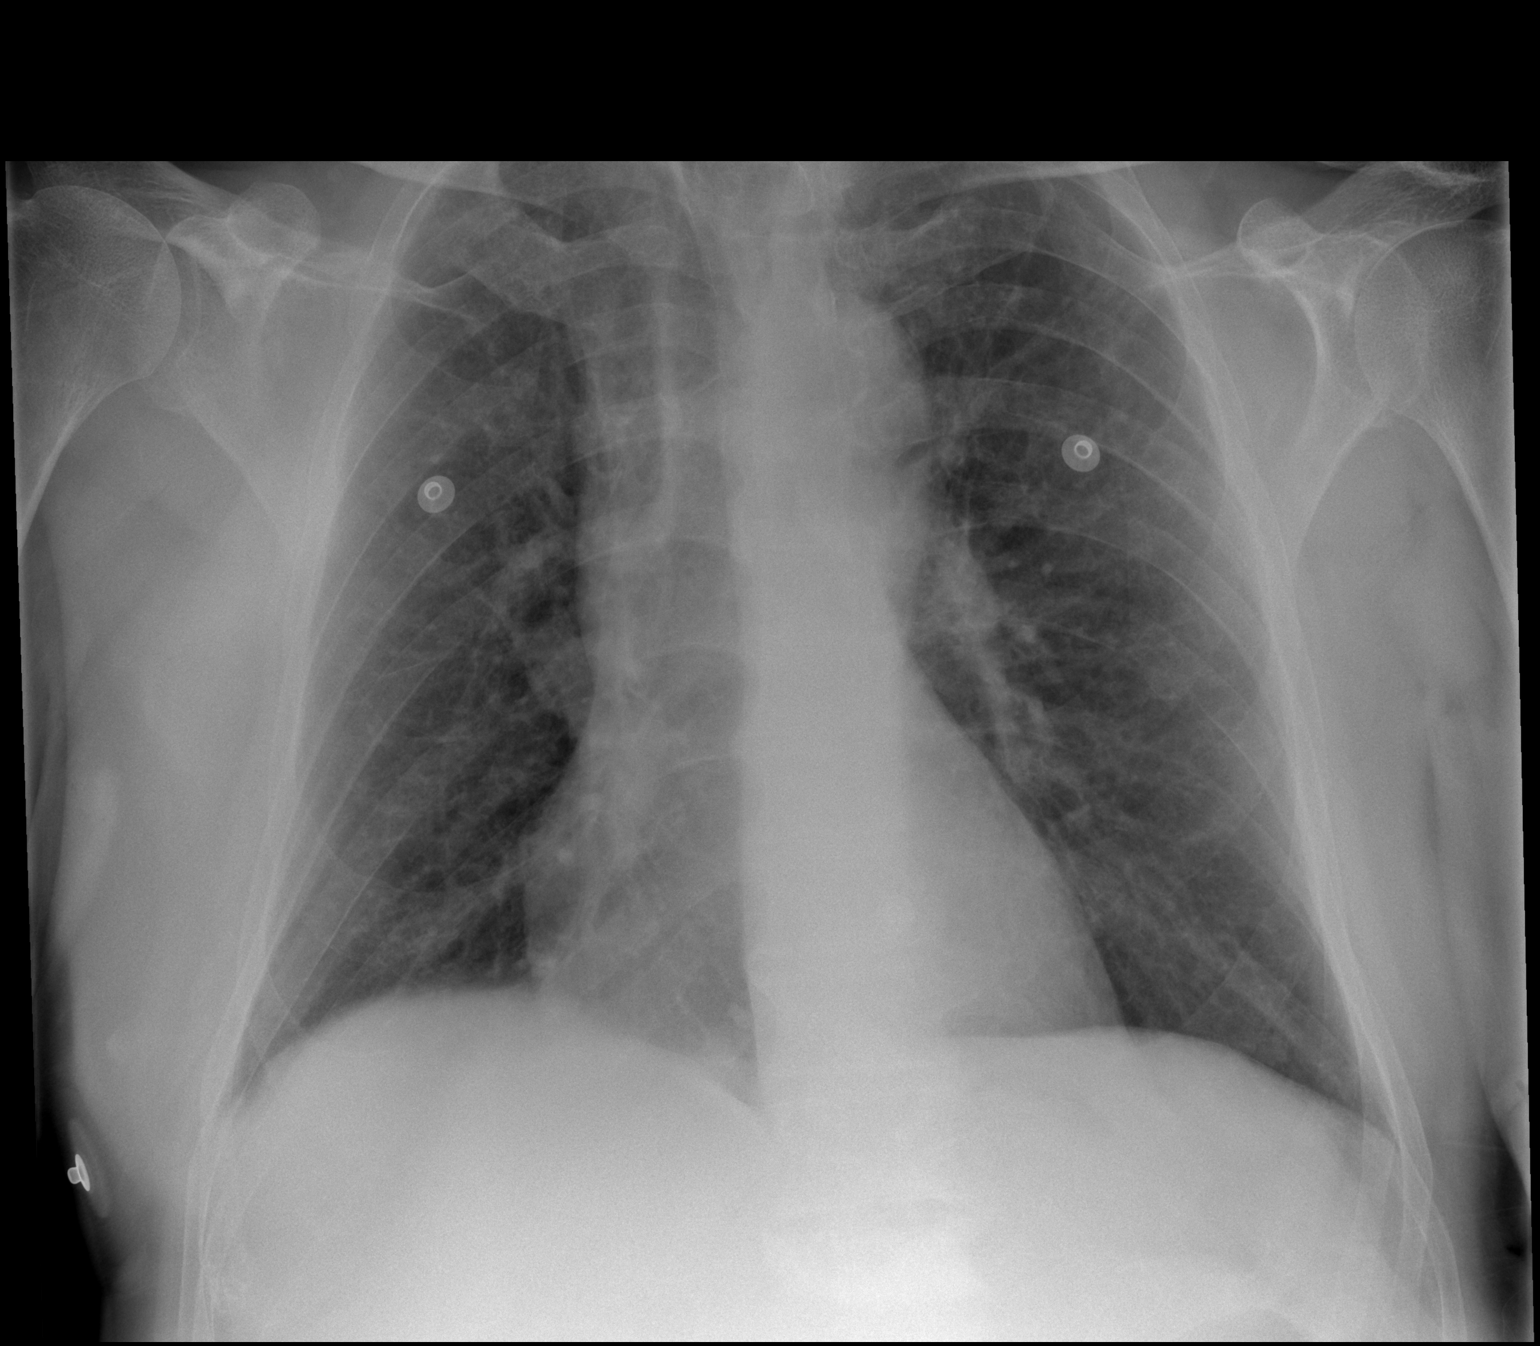
[im 2/2]
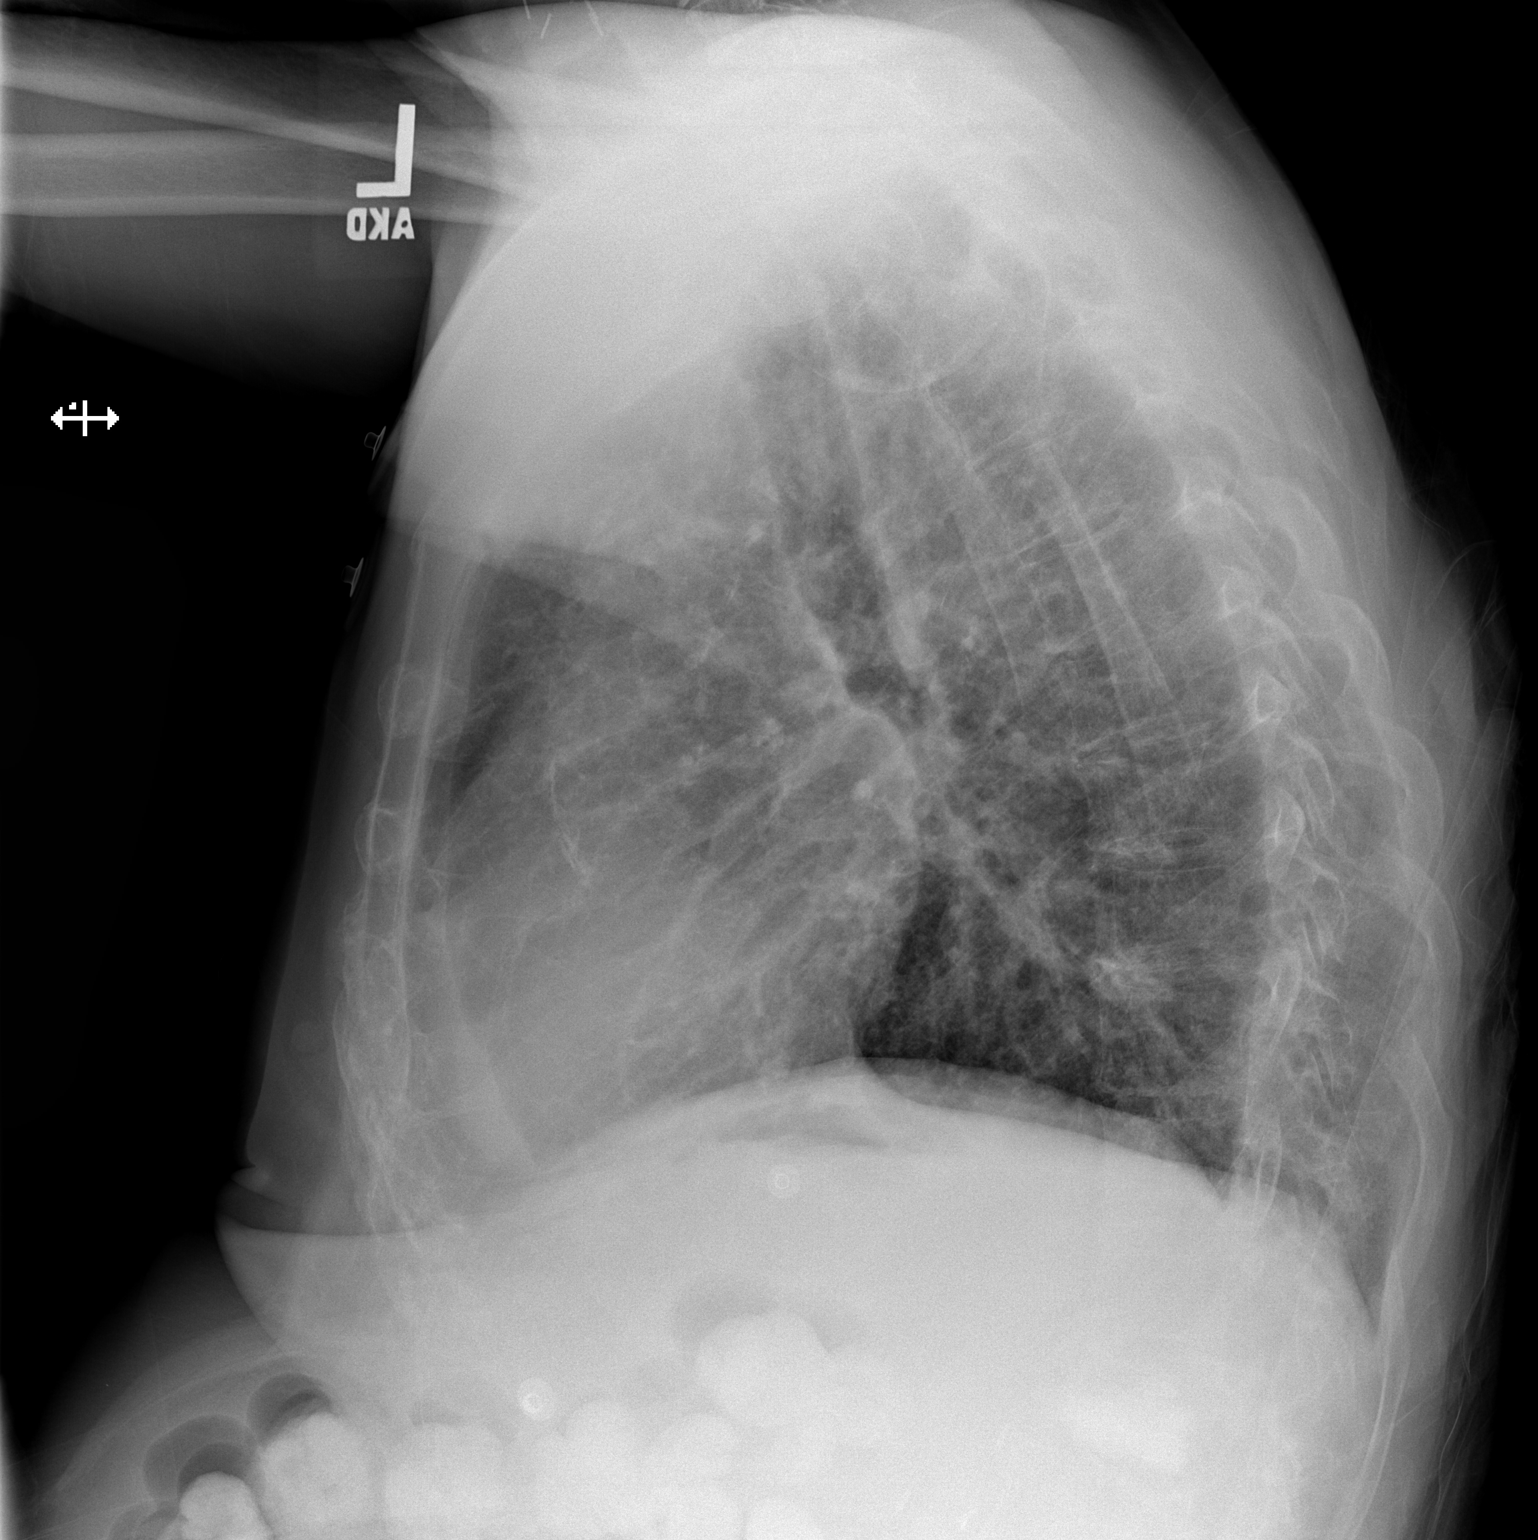

[2 of 2 positions shown; findings below may reference images not displayed]

PROCEDURE:     DXR - DXR CHEST PA (OR AP) AND LATERAL  - April 06, 2012  [DATE]

RESULT:     Comparison is made to the examination dated 17 March, 2012.

The patient is rotated slightly toward the right. There is mild
hyperinflation. The lung markings are slightly prominent which can be
consistent with chronic fibrotic change versus interstitial edema. There is
no effusion. The appearance is similar to that seen previously. The cardiac
size appears normal.
IMPRESSION: 1.     COPD with interstitial prominence most likely secondary to fibrosis.

[REDACTED]

## 2013-09-14 IMAGING — CT CT HEAD WITHOUT CONTRAST
2 series · 15 of 30 positions shown, 19 images · non-contrast
Comparison: none

REASON FOR EXAM: fall x 3
COMMENTS:

[Series 2: without · axial · non-contrast · 0.45mm/px · z∈[+436,+570]mm · 13 of 33 slices shown, 17 images]
[im 3/33  brain]
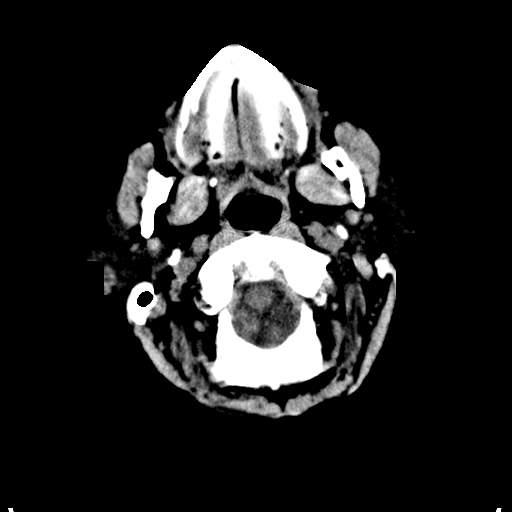
[im 3/33  bone]
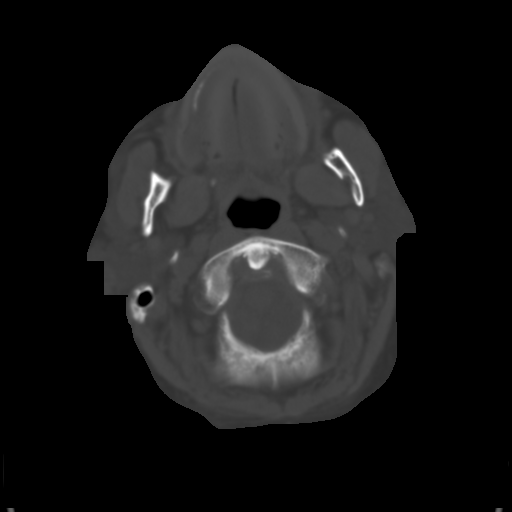
[im 5/33  brain]
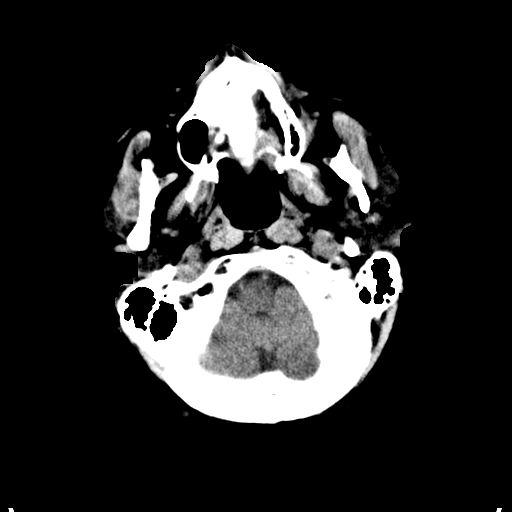
[im 7/33  brain]
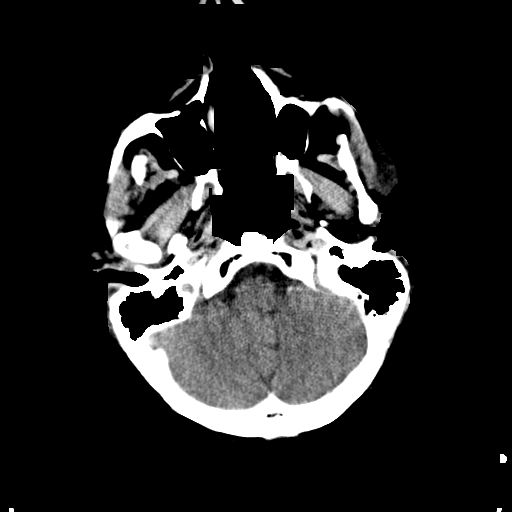
[im 10/33  brain]
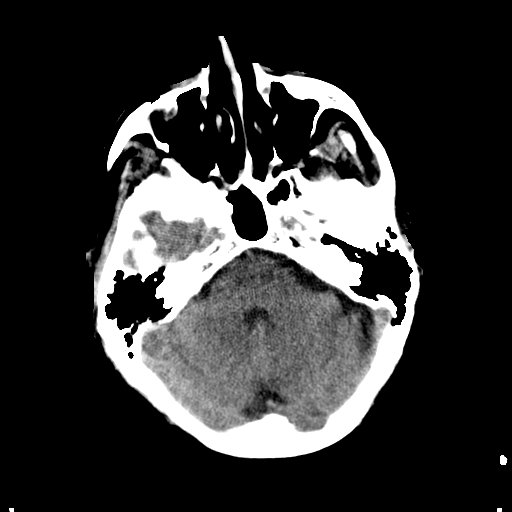
[im 12/33  brain]
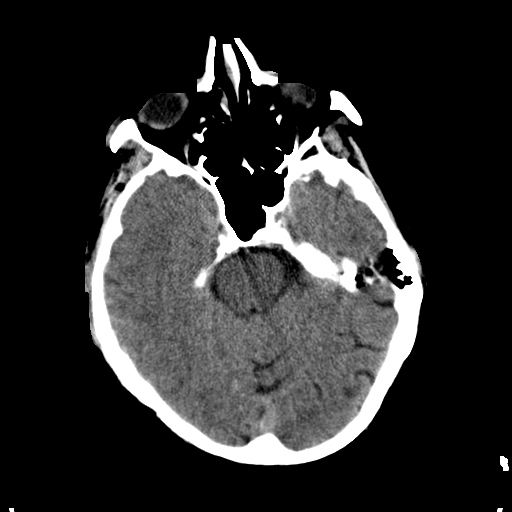
[im 12/33  bone]
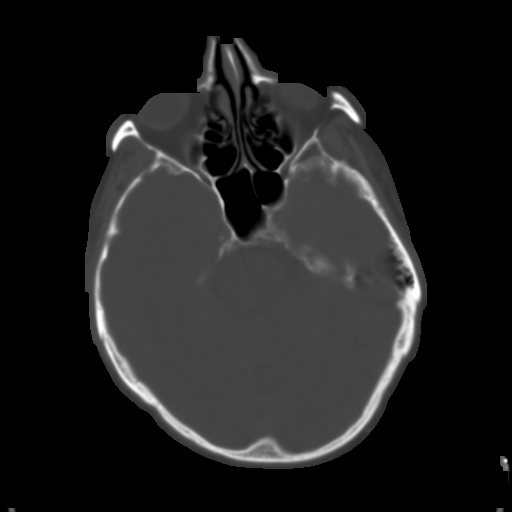
[im 14/33  brain]
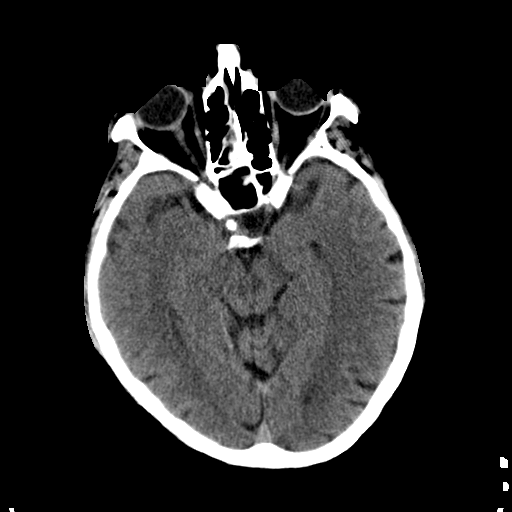
[im 17/33  brain]
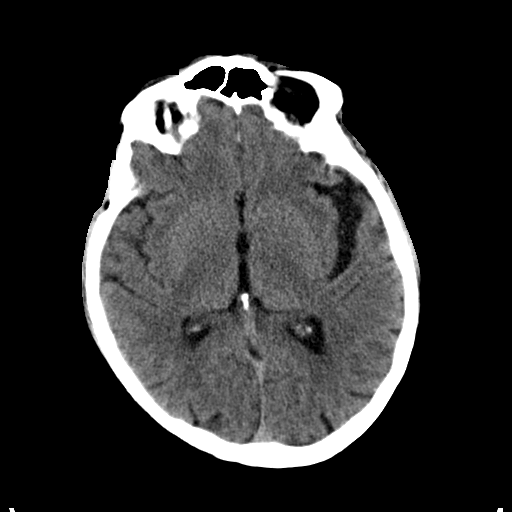
[im 19/33  brain]
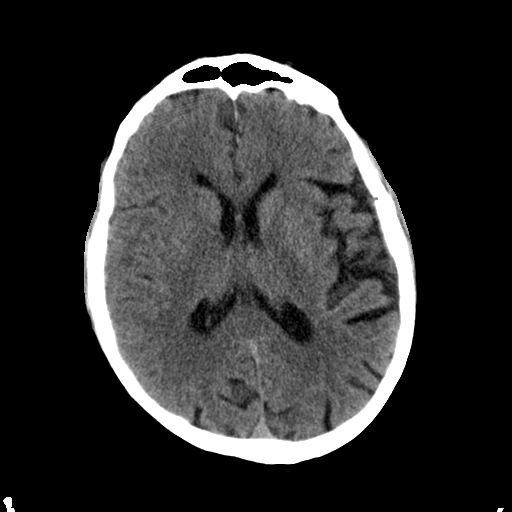
[im 21/33  brain]
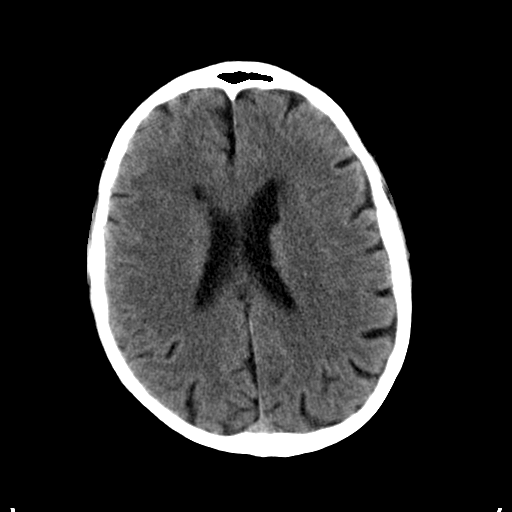
[im 21/33  bone]
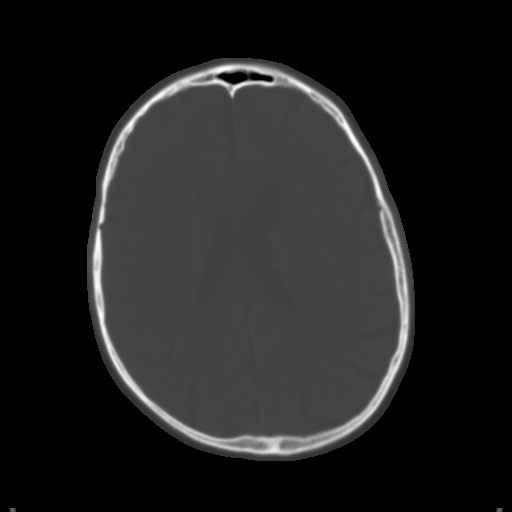
[im 23/33  brain]
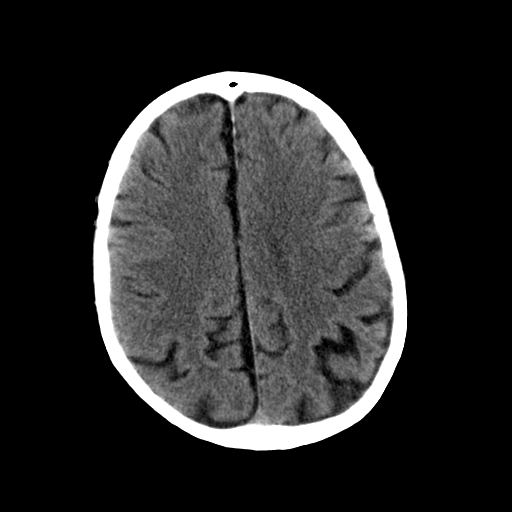
[im 26/33  brain]
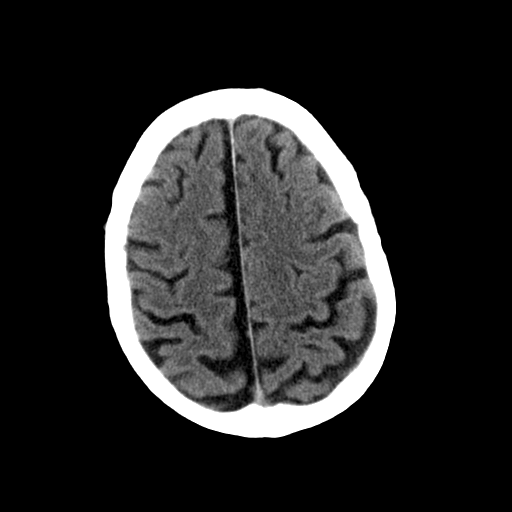
[im 28/33  brain]
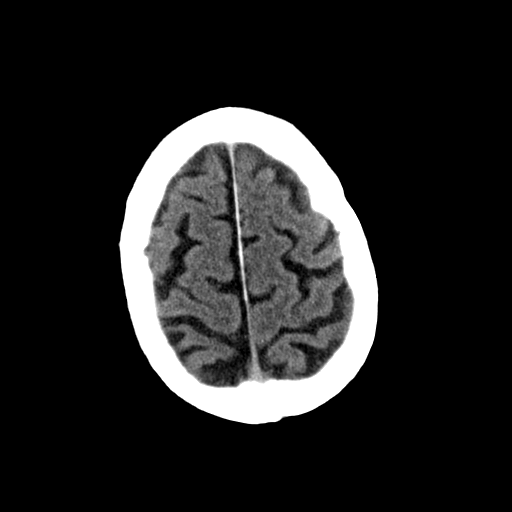
[im 30/33  brain]
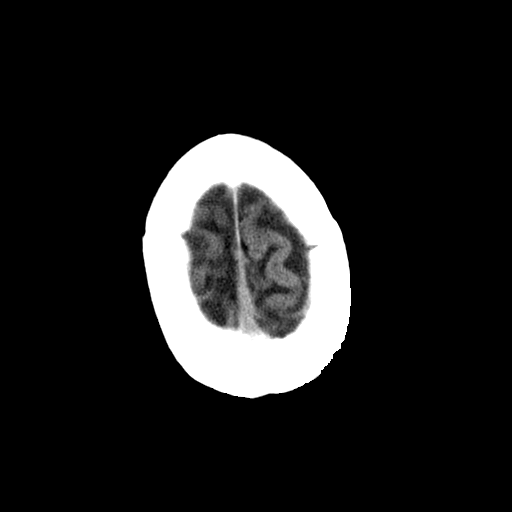
[im 30/33  bone]
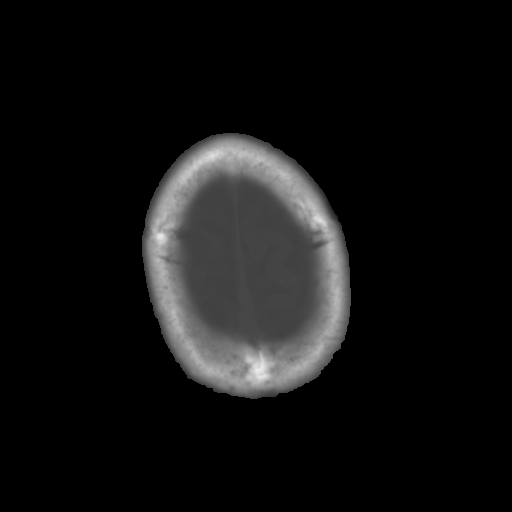

[Series 3: bone · axial · 0.45mm/px · z∈[+436,+460]mm · 2 of 34 slices shown]
[im 3/34  bone]
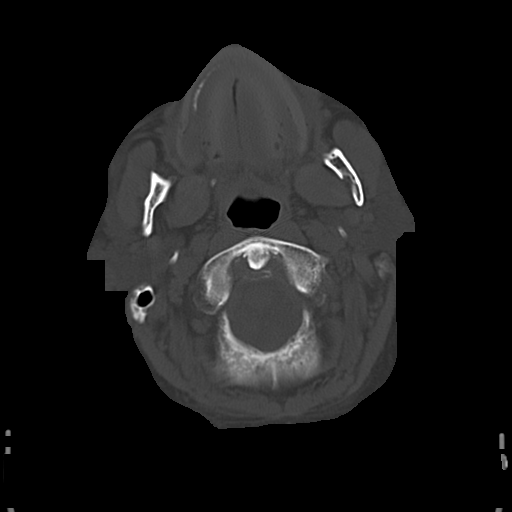
[im 8/34  bone]
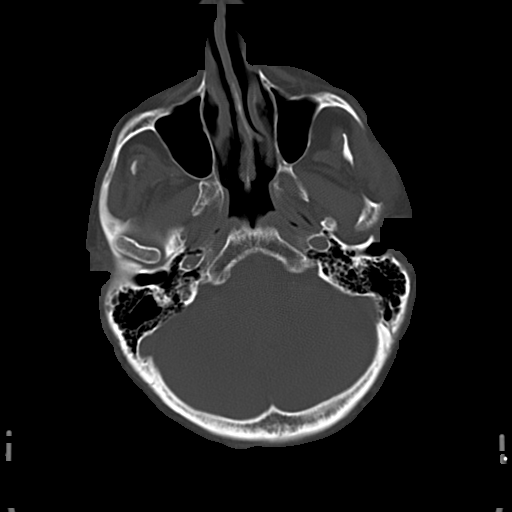

[15 of 30 positions shown; findings below may reference images not displayed]

PROCEDURE:     CT  - CT HEAD WITHOUT CONTRAST  - June 19, 2012 [DATE]

RESULT:     Axial noncontrast CT scanning was performed through the brain
with reconstructions at 5 mm intervals and slice thicknesses. Comparison
made to study 06 April, 2012.

There is mild diffuse cerebral and cerebellar atrophy. This is a stable
finding. There is no intracranial hemorrhage nor intracranial mass effect.
There is no evidence of an evolving ischemic infarction. An old lacunar
infarction in the anterior aspect of the left corpus callosum is present.
The cerebellum and brainstem exhibit no acute abnormalities. At bone window
settings the observed portions of the paranasal sinuses and mastoid air
cells are clear. There is no evidence of an acute skull fracture. There is
no more than very mild soft tissue swelling over the left forehead.
IMPRESSION: 1. There is no evidence of an acute ischemic or hemorrhagic event.
2. There is no evidence of an acute skull fracture.
3. There are mild age-related atrophic changes. An old lacunar infarction in
the corpus callosum anteriorly on the left is present and stable.

[REDACTED]

## 2015-03-08 NOTE — H&P (Signed)
PATIENT NAME:  Stephen Rollins, Stephen Rollins MR#:  [id number] DATE OF BIRTH:  [birth year]  DATE OF ADMISSION:  06/20/2012  PRIMARY CARE PHYSICIAN: Julieanne Mansonichard Gilbert, MD  ER REFERRING PHYSICIAN: Dr. Ladona Ridgelaylor  CHIEF COMPLAINT: Frequent falls recently.   HISTORY OF PRESENT ILLNESS: This is a 80 year old Caucasian male with a history of coronary artery disease status post MI, peripheral vascular disease status post left carotid endarterectomy, hyperlipidemia, and Parkinson's disease who presented to the ED with frequent falls; the patient feel four times yesterday. He fell at home with fever, chills, dizziness, and weakness. The patient also feels congested. According to the patient's wife, the patient has Parkinson's disease with dementia and could not provide detailed information, but the patient is alert, awake, and follows commands. The patient was noted to have a low blood pressure of 84/57 and was given normal saline bolus. He was also noted to have a urinary tract infection and treated with Rocephin. The patient denies any other symptoms.   PAST MEDICAL HISTORY:  1. Coronary artery disease.  2. Peripheral vascular disease. 3. Hyperlipidemia. 4. Parkinson's disease.  5. Dementia.  6. Cerebrovascular accident.   PAST SURGICAL HISTORY:  1. Left carotid endarterectomy. 2. Aortic femoral bypass in 1997.  SOCIAL HISTORY: The patient has smoked half pack a day for 62 years. He denies any alcohol drinking or illicit drugs.   FAMILY HISTORY: Positive for coronary artery disease.  ALLERGIES: No known drug allergies.   HOME MEDICATIONS:  1. Zocor 40 mg p.o. daily.  2. Seroquel 25 mg p.o. daily.  3. Omeprazole 20 mg p.o. at bedtime. 4. Carbidopa/levodopa 50 mg/200 mg p.o. tablets p.o. daily.  5. Aspirin 81 mg p.o. daily.   REVIEW OF SYSTEMS: CONSTITUTIONAL: The patient has fever, chills, dizziness, and generalized weakness, but denies any headache. Good appetite. EYES: No double vision, blurred vision or  cataracts. ENT: No hearing loss, epistaxis, postnasal drip, or slurred speech. No dysphagia. RESPIRATORY: The patient feels congested and has mild cough, but no hematemesis, shortness of breath, or sputum. CARDIOVASCULAR: No chest pain, palpitations, orthopnea, or nocturnal dyspnea. No leg edema. GASTROINTESTINAL: No abdominal pain, nausea, vomiting, or diarrhea. No melena or bloody stool. GENITOURINARY: No dysuria, hematuria, or incontinence. ENDOCRINE: No polyuria, polydipsia, or heat or cold intolerance. HEMATOLOGY: No easy bruising or bleeding. NEUROLOGY: No syncope, loss of consciousness, or seizure.   PHYSICAL EXAMINATION:   VITAL SIGNS: Temperature 98.4 and was 100.4 when the patient came to the ED, pulse 72, respirations 20, blood pressure 89/55,  and oxygen saturation 99% on room air.   GENERAL: The patient is awake and alert, in no acute distress.   HEENT: Pupils are round, equal, and reactive to light and accommodation. Dry oral mucosa. Clear oropharynx.   NECK: Supple. No JVD or carotid bruits. No lymphadenopathy. No thyromegaly. Surgical scar on the left side of the neck.   CARDIOVASCULAR: S1 and S2 regular rate and rhythm. No murmurs or gallops.   PULMONARY: Bilateral air entry. No wheezing or rales. No use of accessory muscles to breathe.  ABDOMEN: Soft. No distention or tenderness. No organomegaly. Bowel sounds present.   EXTREMITIES: No edema, clubbing, or cyanosis. No calf tenderness. Strong bilateral pedal pulses.   SKIN: No rash or jaundice but has some abrasions on the face and head and has a skin tear on the left hand.   NEUROLOGY: The patient is alert and awake but demented. Follows commands, No focal deficit. Power 5/5. Sensation intact. Deep tendon reflexes 2+.   LABORATORY,  DIAGNOSTIC AND RADIOLOGIC DATA: Urinalysis shows RBC 242, WBC 41, and nitrite negative.  CT of cervical spine: Mild degenerative joint changes.   CT of head: No acute ischemia or hemorrhage  event. No  fracture.   WBC 20.6, hemoglobin 15.1, platelets 177, glucose 107, BUN 27, creatinine 1.41, sodium 141, potassium 3.8, and chloride 107.   EKG shows sinus rhythm with occasional PVCs at 79 beats per minute with right bundle branch block.   IMPRESSION:  1. Frequent falls.  2. Urinary tract infection.  3. Systemic antiinflammatory response syndrome. 4. Hypotension.  5. Acute renal failure and dehydration.  6. Coronary artery disease.  7. Peripheral vascular disease.  8. Parkinson's disease.   PLAN OF TREATMENT: The patient will be admitted to the medical floor. We will start fall precautions and continue Rocephin and followup CBC and urine culture. We will start normal saline IV at 100 mL/h and follow-up BMP. For coronary artery disease, we will continue Zocor and aspirin. The patient has had frequent falls and Parkinson's disease and needs placement; however, according to the patient's wife, the patient cannot get skilled nursing facility or assisted living due to financial issue. The patient's wife will discuss with a case Production designer, theatre/television/film.   I discussed the patient's situation and the plan of treatment with the patient's wife.   TIME SPENT: About 55 minutes. ____________________________ Shaune Pollack, MD qc:slb Rollins: 06/20/2012 02:43:10 ET T: 06/20/2012 08:55:30 ET JOB#: [id number]  cc: Shaune Pollack, MD, <Dictator> Richard L. Sullivan Lone, MD Shaune Pollack MD ELECTRONICALLY SIGNED 06/21/2012 16:49

## 2015-03-08 NOTE — Discharge Summary (Signed)
PATIENT NAME:  Stephen Rollins, Stephen Rollins MR#:  [id number] DATE OF BIRTH:  [birth year]  DATE OF ADMISSION:  06/20/2012 DATE OF DISCHARGE:  06/24/2012  ADMITTING DIAGNOSIS: Frequent falls, confusion.   DISCHARGE DIAGNOSES:  1. Frequent falls, confusion, as well as fever due to systemic inflammatory response syndrome as a result of urinary tract infection.  2. Urinary tract infection due to Pseudomonas sensitive to Cipro.  3. Acute renal failure due to dehydration, now resolved.  4. Orthostatic hypotension in the setting of Lewy Body dementia. The patient was treated with Midodrine. However, his blood pressure increased significantly, so this was discontinued.  5. Parkinson's disease. Continue on carbidopa and levodopa.  6. Constipation. Stool softeners.  7. Hyperlipidemia.  8. Peripheral vascular disease.  9. Coronary artery disease.  10. Previous history of cerebrovascular accident.  11. Status post left carotid endarterectomy.  12. Status post aortic femoral bypass in 1997.   PERTINENT LABORATORY AND EVALUATIONS: Admitting BMP: Glucose 107, BUN 27, creatinine 1.41, sodium 141, potassium 3.8, chloride 107, CO2 25. LFTs were normal. WBC count 20.6, hemoglobin 15.1, platelet count 177. EKG showed sinus rhythm with occasional PVCs, right bundle branch block. Blood cultures x2 no growth at 36 hours. Urine culture showed greater than 100,000 Pseudomonas. Urinalysis showed leukocyte 1+, red blood cells 242, white blood cells 41, bacteria 1+. Most recent WBC count on 08/04 9.2, hemoglobin 13.1. His creatinine normalized on 08/03 with 1.15, sodium 140.    CONSULTANT: Case management.   HOSPITAL COURSE: Please refer to history and physical done by the admitting physician. The patient is a 80 year old Caucasian male with history of coronary artery disease, peripheral vascular disease, hyperlipidemia, Parkinson's disease, who was brought to the ED with fevers, frequent falls, chills and dizziness. He was also noted  to be hypotensive on presentation. The patient was noted to have a urinary tract infection, was given IV fluids and started on IV antibiotics. His urine culture did confirm urinary tract infection with Pseudomonas which was sensitive to Cipro. The patient was continued on antibiotics with normalization of his blood pressure. The patient is still very weak and deconditioned and will need further rehab. He did become very agitated and confused as a result of acute delirium. He had to receive Ativan and haloperidol with resolution of his symptoms. The patient has been constipated and we have been giving him stool softeners to help assist with his bowel movements. He is currently doing much better and is stable for discharge.   DISCHARGE MEDICATIONS:  1. Aspirin 81 mg 1 tab p.o. daily.  2. Simvastatin 40 p.o. daily.  3. Omeprazole 20 mg at bedtime.  4. Seroquel 25, one tab p.o. at bedtime.  5. Carbidopa/levodopa 1 tab p.o. b.i.Rollins.  6. Tylenol 650, two tabs q.4 p.r.n. pain or temperature greater than 100.4.  7. Docusate 100, one tab p.o. b.i.Rollins.  8. Cipro 500 mg 1 tab p.o. q.12 x7 days.  9. Senna 1 tab p.o. at bedtime.   DIET: Low sodium.   ACTIVITY: As tolerated. Take fall precautions.   REFERRAL: To rehab.   DISCHARGE FOLLOWUP: Follow-up with Dr. Sullivan LoneGilbert in 1 to 2 weeks.   TIME SPENT ON DISCHARGE: 35 minutes.   ____________________________ Lacie ScottsShreyang H. Allena KatzPatel, MD shp:ap Rollins: 06/24/2012 13:38:03 ET T: 06/24/2012 14:23:40 ET JOB#: [id number]  cc: Thea Holshouser H. Allena KatzPatel, MD, <Dictator> Richard L. Sullivan LoneGilbert, MD Charise CarwinSHREYANG H Gini Caputo MD ELECTRONICALLY SIGNED 06/26/2012 13:42

## 2015-03-11 NOTE — Consult Note (Signed)
General Aspect 80 yo male with history of Parkinsons disease, severe dementia who was admitted after presenting to the er with complaints of chest pain. EKG revealed injury current in the inferior leads. He was felt to be having an acute STEMI. He is under hospice care and is a DNR. Discussion in the er with the famiily by er physicians resulted in the request to proceed with noninvsive, nonagressive treatement deferring emergent cardiac catheterization. He was placed on heparin, nitrates, asa and beta blockers and admitted to telemetry. He is a difficult historian but denies chest pain. He is currently hemodynamically stable.   Physical Exam:  GEN no acute distress   NECK supple   RESP normal resp effort  clear BS  no use of accessory muscles   CARD Regular rate and rhythm  Normal, S1, S2  Murmur   Murmur Systolic   Systolic Murmur Out flow   ABD no hernia  normal BS  no Adominal Mass   LYMPH negative neck, negative axillae   EXTR negative cyanosis/clubbing, negative edema   SKIN normal to palpation, No ulcers   NEURO cranial nerves intact   PSYCH poor insight, lethargic   Review of Systems:  Subjective/Chief Complaint chest pain   General: No Complaints   Skin: No Complaints   ENT: No Complaints   Eyes: No Complaints   Neck: No Complaints   Respiratory: No Complaints   Cardiovascular: Chest pain or discomfort  Tightness   Gastrointestinal: No Complaints   Genitourinary: No Complaints   Vascular: No Complaints   Musculoskeletal: No Complaints   Neurologic: tremors   Hematologic: No Complaints   Endocrine: No Complaints   Psychiatric: dementia   Review of Systems: All other systems were reviewed and found to be negative   Medications/Allergies Reviewed Medications/Allergies reviewed     TIA:    Pre-syncopal:    MI - Myocardial Infarct:    Hypercholesterolemia:    AAA:    CVA:    Parkinson's Disease:    Carotid Endartarectomy:     cardiac stent: 2003   aortic bypass: 1996   Advance Directives Complete: Worked with patient and wife to complete advance directives, 09-Sep-2011  Home Medications: Medication Instructions Status  carbidopa-levodopa 50 mg-200 mg oral tablet, extended release 1 tab(s) orally 2 times a day Active  clonazePAM 0.5 mg oral tablet 0.5-1 tab(s) orally once a day (at bedtime) Active  Depakote 250 mg oral delayed release tablet 1 tab(s) orally 3 times a day Active  Norco 325 mg-5 mg oral tablet 1 tab(s) orally every 6 hours, As Needed - for Pain Active  naproxen 500 mg oral tablet 1 tab(s) orally 2 times a day Active  aspirin 81 mg oral tablet 1 tab(s) orally once a day Active  Seroquel 25 mg oral tablet 1 tab(s) orally once a day Active  omeprazole 20 mg oral delayed release capsule 1 cap(s) orally once a day Active  simvastatin 40 mg oral tablet 1 tab(s) orally once a day (at bedtime) Active   EKG:  EKG NSR    No Known Allergies:    Impression 80 yo male with history of dementia, parkinsons disease who was admitted with an acute inferior stemi. He is being treated medically with noninvasive approach per his family discussion with er physicians. He is in hospice care at home. he is currently on hepairn, asa, beta blockers. Plan is to continue with current meds and defer invasive evaluaiton for now.   Plan 1. Continue with current  meds. 2. Maintain comfort 3. Further recs pending course.   Electronic Signatures: Dalia Heading (MD)  (Signed 07-Apr-14 18:35)  Authored: General Aspect/Present Illness, History and Physical Exam, Review of System, Past Medical History, Home Medications, EKG , Allergies, Impression/Plan   Last Updated: 07-Apr-14 18:35 by Dalia Heading (MD)

## 2015-03-11 NOTE — H&P (Signed)
PATIENT NAME:  Stephen Rollins, Stephen Rollins MR#:  [id number] DATE OF BIRTH:  [birth year]  DATE OF ADMISSION:  02/23/2013  PRIMARY CARE PHYSICIAN:  Dr. Sullivan Lone.  CHIEF COMPLAINT:  Chest pain.   HISTORY OF PRESENT ILLNESS:  This is a 79 year old male who is on hospice secondary to Parkinson's disease and severe Lewy body dementia, who presents with chest pain. The patient has had on and off chest pain since Saturday. He has been seen by hospice at home; however, this morning, he woke up about 4:15, and then at 5:00, he started having substernal chest pain radiating to his shoulders and his back.  It was 10 out of 10, the worst it has over the past couple of days. It lasted over 3 hours.  The hospice nurse came in about 7:00 to 7:30, and the patient was brought to the ER via EMS around 8:00. EKG shows ST elevation MI. The patient had decided that he did not want cardiac catheterization or aggressive management. Dr. Lady Gary saw the patient while in the ER. He recommended conservative treatment, as per the patient's wishes, including heparin drip, nitro as tolerated, and beta blocker if tolerated. He did receive some nitro; however, his blood pressure dropped. He is currently chest pain free.   REVIEW OF SYSTEMS:  CONSTITUTIONAL: No fevers. He has some nausea and fatigue.  EYES: No blurred vision.  HEAD, EAR, NOSE, THROAT:  Some mild hearing loss. No postnasal drip. No sinus pain. No seasonal allergies.  RESPIRATORY: He has a mild cough. No wheezing, hemoptysis, COPD. No dyspnea.  CARDIOVASCULAR: Positive chest pain. Positive palpitations. No syncope or orthopnea. No edema. No dyspnea on exertion.  GASTROINTESTINAL  He was feeling a little nauseous this morning. No vomiting, diarrhea, abdominal pain, melena, or ulcers.    GENITOURINARY:  No dysuria or hematuria. ENDOCRINE: No polyuria or polydipsia.  HEMATOLOGIC/LYMPHATICS:  No easy bruising, bleeding or swollen glands.  SKIN: No rash or lesions.  MUSCULOSKELETAL:  He  has some limited activity due to his Parkinson's disease. He walks with a walker.  NEUROLOGIC:  He has a history of CVAs. No ataxia. He has history of Parkinson's disease and Lewy body dementia.   PSYCHIATRIC:  No anxiety.   PAST MEDICAL HISTORY:   1.  Lewy body dementia.  2.  Parkinson's disease.  3.  TIA. 4.  CAD.  5.  Hyperlipidemia.  6.  Previous history of AAA.    PAST SURGICAL HISTORY: 1.  CEA.  2.  Cardiac stent.  3.  Aortic bypass.   MEDICATIONS: 1.  Zocor 40 mg daily.  2.  Seroquel 25 mg daily.  3.  Omeprazole 20 mg daily.  4.  Norco 5/325 q.6 hours p.r.n.  5.  Naproxen 500 mg b.i.d.  6.  Depakote 250 mg t.i.d.  7.  Clonazepam 0.5, half tablet to 1 tablet daily p.r.n.  8.  Carbidopa-levodopa 50/200 mg 1 tablet b.i.d. 9.  Aspirin 81 mg daily.    ALLERGIES:  No known drug allergies.   SOCIAL HISTORY: The patient smokes 5 small cigars a day. He is not interested in quitting those cigars.  No alcohol use. No IV drug use.   FAMILY HISTORY: Positive for sudden cardiac death and his mother had rheumatoid arthritis.   PHYSICAL EXAMINATION: VITAL SIGNS: Temperature 97.7, pulse 72, respirations 18, blood pressure 120/62, 100% on room air.  GENERAL: The patient is alert, oriented x3, not in acute distress.  HEENT: Head is atraumatic. Pupils are round. Sclerae anicteric. Mucous membranes  are moist.  Oropharynx is clear.  NECK: Supple without JVD, carotid bruit or enlarged thyroid.  CARDIOVASCULAR: Regular rate and rhythm. No gallop.   He has a 2/6 systolic ejection murmur heard best at the left sternal border without radiation.  PMI is not displaced.  LUNGS: Clear to auscultation without crackles, rales. He has some moderate scattered rhonchi.  ABDOMEN: Bowel sounds are positive. Nontender, nondistended. No hepatosplenomegaly.  EXTREMITIES: No clubbing, cyanosis or edema.  NEUROLOGIC: Cranial nerves II through XII are grossly intact. There are no focal deficits. He has got a  slight tremor in his hand, likely Parkinson's tremor.  MUSCULOSKELETAL:  Strength 4/5 bilaterally and symmetrically.  SKIN: Without rashes or lesions.   LABORATORY, DIAGNOSTIC AND RADIOGRAPHIC DATA:  His white blood cell is 13, hemoglobin 16.7, hematocrit 48.8, platelets are 185. Sodium 137, potassium 4.2, chloride 104, bicarb 30, BUN 26, creatinine 1.50, glucose 180. Troponin 0.37, CK 87, CK-MB is 4.2. INR 0.9. Chest x-ray shows no acute cardiopulmonary disease. EKG shows ST elevation in the inferior leads.   ASSESSMENT AND PLAN:  This is a 80 year old male who is on hospice for Parkinson's disease and Lewy body dementia, presents with chest pain, found to have ST elevation myocardial infarction, who prefers conservative management at this time.  1.  ST elevation myocardial infarction. The patient will be admitted to telemetry. Will continue with conservative management including beta blocker as blood pressure tolerates, aspirin, statin and nitroglycerin. He is currently chest pain-free.  Dr. Lady GaryFath has see the patient in consultation down in the ER. We will continue with cardiology consultation.  2.  History of Parkinson's disease and Lewy body dementia. We will continue his outpatient medication regimen.  3.  Acute renal insufficiency. We will gently hydrate the patient and monitor creatinine. Hold any nephrotoxic agents.  4.  Leukocytosis, likely stress reaction from ST elevation myocardial infarction. Chest x-ray shows no acute cardiopulmonary disease. We can check a urinalysis to rule out infection and follow CBC.   CODE STATUS: The patient is a DNR STATUS.     TIME SPENT: Approximately 50 minutes.    ____________________________ Janyth ContesSital P. Juliene PinaMody, MD spm:dmm D: 02/23/2013 10:41:00 ET T: 02/23/2013 11:09:09 ET JOB#: [id number]  cc: Carder Yin P. Juliene PinaMody, MD, <Dictator> Richard L. Sullivan LoneGilbert, MD Janyth ContesSITAL P Makayla Confer MD ELECTRONICALLY SIGNED 02/23/2013 15:39

## 2015-03-11 NOTE — Discharge Summary (Signed)
PATIENT NAME:  Stephen Rollins, Stephen Rollins MR#:  [id number] DATE OF BIRTH:  1934/12/08  DATE OF ADMISSION:  02/23/2013 DATE OF DISCHARGE:  02/26/2013  PRIMARY CARE PHYSICIAN:  Clarie Camey L. Sullivan Lone, MD  FINAL DIAGNOSES:   1.  ST-segment elevation myocardial infarction.  2.  Encephalopathy.  3.  Failure to thrive.  4.  Lewy body dementia.  5.  Parkinson's.   CODE STATUS:  The patient is a DO NOT RESUSCITATE.   DIET:  As tolerated.   ACTIVITY:  As tolerated.   MEDICATIONS ON DISCHARGE TO THE HOSPICE HOME:  Include carbidopa/levodopa 50/200 mg 1 tablet twice a day, clonazepam 0.5 mg 1/2 to 1 tablet at bedtime, aspirin 81 mg daily, Tylenol 325 mg 2 tablets every 4 hours as needed for pain or temperature, Nitrostat 0.4 mg sublingually every 5 minutes as needed for chest pain, Roxanol 20 mg per 5 mL oral solution 2.5 mg every 90 minutes as needed for pain or shortness of breath.   HOSPITAL COURSE:  The patient was admitted on February 23, 2013 and discharged to the [facility] on February 26, 2013. The patient was admitted for dementia. He was followed by hospice secondary to Parkinson's disease and Lewy body dementia. The patient was admitted with chest pain. EKG showed an ST elevation myocardial infarction. The family decided on conservative treatment. The patient was initially started on heparin drip, Coreg and aspirin. The patient was also given IV fluids for acute renal insufficiency. His leukocytosis was likely secondary to the acute myocardial infarction.   LABORATORY AND RADIOLOGICAL DATA DURING THE HOSPITAL COURSE:  Included an EKG that showed ST elevations inferiorly, incomplete right bundle branch block and normal sinus rhythm at 84 beats per minute. PT, INR and PTT are in the normal range. First troponin was 0.37. Glucose was 108, BUN 26, creatinine 1.5, sodium 137, potassium 4.2, chloride 104, CO2 was 30, calcium 10.3. White blood cell count was 13.0, H and H 16.7 and 48.8, platelet count 185. Chest x-ray  showed no acute cardiopulmonary disease. Next troponin went up to 15 and next troponin up to 34.3. On February 24, 2013, creatinine was 1.12. Echocardiogram showed left ventricular ejection fraction of 40 to 45%, dilated left atrium, mild mitral valve regurgitation   HOSPITAL COURSE PER PROBLEM LIST:  1.  For the patient's ST elevation myocardial infarction, the patient was initially treated conservatively. They declined cardiac catheterization. He was given aspirin, beta-blocker, heparin drip and nitroglycerin. Heparin drip was stopped after 48 hrs.  Since the patient was discharged to the [facility], he was only continued on aspirin and as few medications as possible were given. Overall prognosis is poor. Beta-blocker was stopped since the patient is going to the Hospice Home.  2.  Encephalopathy likely secondary to the Lewy body dementia. He did have periods of agitation for which I did give clonazepam.  3.  Failure to thrive. The patient has not been eating well since the myocardial infarction. I was hesitant on sending him out to a rehab facility. Since he was followed by hospice as outpatient, we did contact the hospice liaison to transfer to the Hospice Home. Depending on how he does with his clinical course, diet will be as tolerated.  4.  For the patient's Parkinson's, I did leave him on Sinemet. Overall prognosis is poor with STEMI, Lewy body dementia, poor appetite, poor intake and high risk for cardiopulmonary arrest. The patient is DO NOT RESUSCITATE. Comfort measures will be employed at the [facility].  This had been discussed with the hospice liaison and the wife who is at the bedside during the hospital stay.   TIME SPENT ON DISCHARGE:  Greater than 35 minutes.    ____________________________ Herschell Dimesichard J. Renae GlossWieting, MD rjw:si D: 02/26/2013 16:12:48 ET T: 02/26/2013 16:53:37 ET JOB#: [id number]  cc: Herschell Dimesichard J. Renae GlossWieting, MD, <Dictator> Shuntae Herzig L. Sullivan LoneGilbert, MD Salley ScarletICHARD J Mirella Gueye  MD ELECTRONICALLY SIGNED 10-07-13 19:17

## 2015-03-13 NOTE — Discharge Summary (Signed)
PATIENT NAME:  Stephen Rollins, Stephen Rollins MR#:  [id number] DATE OF BIRTH:  May 12, 1935  DATE OF ADMISSION:  04/07/2012 DATE OF DISCHARGE:  04/10/2012  PRESENTING COMPLAINT: Altered mental status. Near-syncope.   DISCHARGE DIAGNOSES:  1. Presyncope/weakness with frequent episodes of disorientation, suspected from orthostatic hypotension, likely due to autonomic dysfunction from Parkinson's disease.  2. Dehydration, resolved.  3. Urinary tract infection, asymptomatic, treated with three days IV antibiotics.  4. Parkinson's disease with Lewy Body disease and progressive dementia.  5. History of peripheral vascular disease and carotid artery stenosis, status post carcinoma in the past.  6. Coronary artery disease, status post myocardial infarction.   CONDITION ON DISCHARGE: Fair.   LONG-TERM PROGNOSIS: Poor.   CODE STATUS: FULL CODE.   DIET: Regular.   REFERRAL: Physical therapy.   DISCHARGE INSTRUCTIONS: 1. Fall risk prevention.  2. The patient may need assistance with meals.   FOLLOWUP:  [facility]. [facility].  2. Follow up with primary care physician, Dr. Sullivan LoneGilbert, as needed.   DISCHARGE MEDICATIONS:  1. Aspirin 81 mg daily.  2. Tylenol 650 mg p.o. every four hours p.r.n.   3. Zocor 40 mg at bedtime.  4. Omeprazole 20 mg daily.  5. Carbidopa/levodopa 25/100, 1 tablet t.i.Rollins. with meals.   LABS AT DISCHARGE: CBC within normal limits. Basic metabolic panel within normal limits. Cholesterol panel within normal limits. Urine culture contamination. Troponin x3 negative. Urinalysis is positive for urinary tract infection. EKG: Normal sinus rhythm. CT of the head: No acute abnormality. MRI of the brain: No acute infarct. Chest x-ray: Chronic obstructive pulmonary disease with interstitial prominence. LFTs within normal limits. Albumin 3.0. Carotid Doppler ultrasound: No hemodynamically significant stenosis noted.   CONSULTATION: Physical therapy,  recommendations STR.   HISTORY OF PRESENT ILLNESS: Stephen Rollins is a 80 year old Caucasian gentleman with past medical history of Parkinson's disease with Lewy Body disease along with coronary artery disease, status post myocardial infarction and history of cerebrovascular accident in the past comes in with:  1. Presyncopal episode/weakness. The patient was found to have orthostatic hypotension and was attributed likely due to dehydration, mild urinary tract infection and possibly from autonomic dysfunction secondary to his Parkinson's disease. He was started on IV fluids. Blood pressure remained stable. Work-up for cerebrovascular accident remained negative and as far as MRI of the brain and carotid Doppler's were negative. Physical therapy saw the patient and recommended rehab. Hence, the patient is going to go to [facility] today.  2. Urinary tract infection based of abnormal urinalysis. Urine cultures contaminated. Completed three days of IV Rocephin for asymptomatic urinary tract infection.  3. Coronary artery disease, status post myocardial infarction in the past. The patient is on aspirin and statin. Cardiac enzymes are negative.  4. History of peripheral vascular disease and carotid artery stenosis. Aspirin and statins were continued.  5. Parkinson's disease with Lewy Body disease and progressive dementia. I did have a long conversation with wife. Overall prognosis appears to be poor. Wife understands. I did mention to her about thinking about long-term placement. [facility].   The patient remained a FULL CODE.    TIME SPENT: 40 minutes.    ____________________________ Wylie HailSona A. Allena KatzPatel, MD sap:ap Rollins: 04/10/2012 10:21:04 ET T: 04/10/2012 11:16:37 ET JOB#: [id number]  cc: Derron Pipkins A. Allena KatzPatel, MD, <Dictator> [facility] Richard L. Sullivan LoneGilbert, MD Willow OraSONA A Ayomide Purdy MD ELECTRONICALLY SIGNED 04/18/2012 22:46

## 2015-03-13 NOTE — H&P (Signed)
PATIENT NAME:  Stephen Rollins, Stephen Rollins MR#:  [id number] DATE OF BIRTH:  1935/04/20  DATE OF ADMISSION:  04/07/2012  PRIMARY CARE PHYSICIAN: Julieanne Mansonichard Gilbert, MD  CHIEF COMPLAINT: Near syncope.   HISTORY OF PRESENT ILLNESS: Stephen Rollins is a 80 year old pleasant Caucasian male with past medical history of Parkinson's disease and early dementia and history of coronary artery disease. He was in his usual state of health until today. According to the wife, he finished his dinner then he leaned forward and he put both hands on his face His wife asked him if he was all right, but he did not respond. He went backwards and it appeared that he was not with it, but he did not lose consciousness completely. Then he went to the floor and he laid down. He could not sit up even after he tried. The patient was brought to the emergency department for evaluation and findings here are unremarkable, except for significant postural hypotension. His blood pressure dropped from 160 systolic down to 098112. The patient was admitted for further evaluation and management. I asked the wife if he had any mental status change or if he was confused since he had similar presentation in October of last year when he was admitted to this hospital, however, the wife states that his mind has been fine in the last few days, in fact he was at his best condition today until this episode of near fainting episode.   REVIEW OF SYSTEMS: CONSTITUTIONAL: Denies any fever, no chills, and no sweating, but he had fatigue. EYES: No blurring of vision. No double vision. ENT: No hearing impairment. No sore throat. No dysphagia. CARDIOVASCULAR: No chest pain, no shortness of breath, no palpitations, and no edema, but he had this near fainting episode. RESPIRATORY: No shortness of breath, no cough, no sputum production, and no chest pain. GASTROINTESTINAL: No abdominal pain, no vomiting or diarrhea. GENITOURINARY: No dysuria and no frequency of urination. MUSCULOSKELETAL:  No joint pain or swelling. No muscular pain or swelling. INTEGUMENTARY: No skin rash. No ulcers. NEUROLOGIC: No focal weakness, no seizure activity, and no headache. PSYCHIATRY: No anxiety and no depression. ENDOCRINE: No polyuria or polydipsia and no heat or cold intolerance.   PAST MEDICAL HISTORY:  1. Coronary artery disease status post myocardial infarction. 2. Peripheral vascular disease status post left carotid endarterectomy. 3. Stroke post-endarterectomy surgery. 4. Hypercholesterolemia.  5. Parkinson's disease.   SOCIAL HABITS: Chronic smoker. He continues to smoke about 3/4 pack of cigarettes daily. He drinks alcohol only rarely or occasionally.   SOCIAL HISTORY: He is retired. He used to be the [employer]. The patient is married and he lives with his wife.   FAMILY HISTORY: Positive for coronary artery disease.   ADMISSION MEDICATIONS:  1. Simvastatin 40 mg a day. 2. Omeprazole 20 mg a day.  3. Carbidopa with levodopa 25/100 mg one tablet three times daily. 4. Seroquel 25 mg at night.  5. Aspirin 81 mg a day. 6. Naproxen p.r.n. after having recent vertebral fracture and then underwent cement procedure.  ALLERGIES: No known drug allergies.   PHYSICAL EXAMINATION:   VITAL SIGNS: Blood pressure in the lying position was 161/88 with a pulse of 55. In the sitting position, the blood pressure was 149/70 with a pulse of 63. In the standing position, blood pressure dropped to 112/54 with a pulse of 61. Respiratory rate 18, temperature 98, and oxygen saturation 96%.   GENERAL APPEARANCE: Elderly male lying in bed in no acute distress,  looks sleepy, healthy looking.   HEAD AND NECK: No pallor. No icterus. No cyanosis.   EARS, NOSE, AND THROAT: Hearing was normal. Nasal mucosa, lips, and tongue were normal.   EYES: Normal eyelids and conjunctiva. Pupils are about 4 to 5 mm, equal and sluggishly reactive to light.   NECK: Supple. Trachea at midline. No thyromegaly.  No cervical lymphadenopathy. No masses.   HEART: Normal S1 and S2. No S3 or S4. No murmur. No gallop. No carotid bruits.   LUNGS: Normal breathing pattern without use of accessory muscles. No rales. No wheezing.   ABDOMEN: Soft without tenderness. No hepatosplenomegaly. No masses. No hernias.   SKIN: No ulcers. No subcutaneous nodules.   MUSCULOSKELETAL: No joint swelling. No clubbing.   NEUROLOGIC: Cranial nerves II through XII are intact. No focal motor deficit.   PSYCHIATRY: The patient is alert and oriented to place and people. Mood and affect were normal.  LABS/STUDIES: EKG showed normal sinus rhythm at rate of 60 per minute. Nonspecific T wave abnormalities, otherwise unremarkable EKG.  CAT scan of the head without contrast showed no significant interval change compared to the old CAT scan. There is diffuse cerebral atrophy consistent with the patient's age and minimal diffuse heterogenicity of the white matter attenuation consistent with chronic white matter ischemic changes.   Serum glucose 107. BNP 287. BUN 25, creatinine 1.1, sodium 142, potassium 4.1, albumin 3, and total protein 6. Liver transaminases were normal. Troponin less than 0.02. CBC showed white count of 8000, hemoglobin 14, hematocrit 41, and platelet count 176.   ASSESSMENT:  1. Presyncope secondary to significant postural or orthostatic hypotension.  2. Orthostatic hypotension. 3. Parkinson's disease.  4. Coronary artery disease.  5. Peripheral vascular disease.  6. Hyperlipidemia.   PLAN: We will admit the patient to the medical floor for observation. Place on telemetry to monitor for any arrhythmias. Check cardiac enzymes. The patient appears to be dehydrated and his BUN/creatinine ratio is elevated. We will place on normal saline at 100 mL/h. I will continue the rest of his home medications, but I will hold Seroquel. Neuro checks every four hours. I spoke to the patient's wife and she reported that he has a  Living Will and there is a copy in our records from previous admissions. He is FULL CODE and he gave the power of attorney to his wife.   TIME SPENT EVALUATING THIS PATIENT: More than 55 minutes. ____________________________ Carney Corners. Rudene Re, MD amd:slb Rollins: 04/07/2012 01:39:51 ET T: 04/07/2012 07:42:40 ET JOB#: [id number]  cc: Carney Corners. Rudene Re, MD, <Dictator> Richard L. Sullivan Lone, MD Carney Corners Domanick Cuccia MD ELECTRONICALLY SIGNED 04/07/2012 22:16
